# Patient Record
Sex: Female | Born: 1950 | Race: White | Hispanic: No | Marital: Married | Smoking: Former smoker
Health system: Southern US, Community
[De-identification: ages and names within clinical notes are randomized; demographics above are authoritative.]

## PROBLEM LIST (undated history)

## (undated) DIAGNOSIS — G4730 Sleep apnea, unspecified: Secondary | ICD-10-CM

## (undated) DIAGNOSIS — E785 Hyperlipidemia, unspecified: Secondary | ICD-10-CM

## (undated) HISTORY — PX: OTHER SURGICAL HISTORY: SHX169

## (undated) HISTORY — PX: GYNECOLOGIC CRYOSURGERY: SHX857

## (undated) HISTORY — PX: ANKLE FRACTURE SURGERY: SHX122

## (undated) HISTORY — DX: Sleep apnea, unspecified: G47.30

## (undated) HISTORY — DX: Hyperlipidemia, unspecified: E78.5

---

## 1998-10-15 ENCOUNTER — Other Ambulatory Visit: Admission: RE | Admit: 1998-10-15 | Discharge: 1998-10-15 | Payer: Self-pay | Admitting: Obstetrics and Gynecology

## 1999-12-24 ENCOUNTER — Other Ambulatory Visit: Admission: RE | Admit: 1999-12-24 | Discharge: 1999-12-24 | Payer: Self-pay | Admitting: Obstetrics and Gynecology

## 1999-12-30 ENCOUNTER — Encounter: Admission: RE | Admit: 1999-12-30 | Discharge: 1999-12-30 | Payer: Self-pay | Admitting: Obstetrics and Gynecology

## 1999-12-30 ENCOUNTER — Encounter: Payer: Self-pay | Admitting: Obstetrics and Gynecology

## 2000-01-05 ENCOUNTER — Encounter: Payer: Self-pay | Admitting: Obstetrics and Gynecology

## 2000-01-05 ENCOUNTER — Encounter: Admission: RE | Admit: 2000-01-05 | Discharge: 2000-01-05 | Payer: Self-pay | Admitting: Obstetrics and Gynecology

## 2001-03-06 ENCOUNTER — Encounter: Payer: Self-pay | Admitting: Obstetrics and Gynecology

## 2001-03-06 ENCOUNTER — Encounter: Admission: RE | Admit: 2001-03-06 | Discharge: 2001-03-06 | Payer: Self-pay | Admitting: Obstetrics and Gynecology

## 2001-03-13 ENCOUNTER — Other Ambulatory Visit: Admission: RE | Admit: 2001-03-13 | Discharge: 2001-03-13 | Payer: Self-pay | Admitting: Obstetrics and Gynecology

## 2002-03-09 ENCOUNTER — Encounter: Admission: RE | Admit: 2002-03-09 | Discharge: 2002-03-09 | Payer: Self-pay | Admitting: Obstetrics and Gynecology

## 2002-03-09 ENCOUNTER — Encounter: Payer: Self-pay | Admitting: Obstetrics and Gynecology

## 2002-04-06 ENCOUNTER — Other Ambulatory Visit: Admission: RE | Admit: 2002-04-06 | Discharge: 2002-04-06 | Payer: Self-pay | Admitting: Obstetrics and Gynecology

## 2003-01-02 ENCOUNTER — Ambulatory Visit (HOSPITAL_BASED_OUTPATIENT_CLINIC_OR_DEPARTMENT_OTHER): Admission: RE | Admit: 2003-01-02 | Discharge: 2003-01-02 | Payer: Self-pay | Admitting: Internal Medicine

## 2003-03-28 ENCOUNTER — Encounter: Admission: RE | Admit: 2003-03-28 | Discharge: 2003-03-28 | Payer: Self-pay | Admitting: Obstetrics and Gynecology

## 2003-03-28 ENCOUNTER — Encounter: Payer: Self-pay | Admitting: Obstetrics and Gynecology

## 2003-05-10 ENCOUNTER — Encounter: Admission: RE | Admit: 2003-05-10 | Discharge: 2003-05-10 | Payer: Self-pay | Admitting: Radiology

## 2004-04-14 ENCOUNTER — Other Ambulatory Visit: Admission: RE | Admit: 2004-04-14 | Discharge: 2004-04-14 | Payer: Self-pay | Admitting: Obstetrics and Gynecology

## 2004-10-21 ENCOUNTER — Encounter: Admission: RE | Admit: 2004-10-21 | Discharge: 2004-10-21 | Payer: Self-pay | Admitting: Obstetrics and Gynecology

## 2005-04-15 ENCOUNTER — Other Ambulatory Visit: Admission: RE | Admit: 2005-04-15 | Discharge: 2005-04-15 | Payer: Self-pay | Admitting: Obstetrics and Gynecology

## 2005-12-15 ENCOUNTER — Encounter: Admission: RE | Admit: 2005-12-15 | Discharge: 2005-12-15 | Payer: Self-pay | Admitting: Obstetrics and Gynecology

## 2006-12-20 ENCOUNTER — Encounter: Admission: RE | Admit: 2006-12-20 | Discharge: 2006-12-20 | Payer: Self-pay | Admitting: Obstetrics and Gynecology

## 2007-12-25 ENCOUNTER — Encounter: Admission: RE | Admit: 2007-12-25 | Discharge: 2007-12-25 | Payer: Self-pay | Admitting: Obstetrics and Gynecology

## 2007-12-26 ENCOUNTER — Other Ambulatory Visit: Admission: RE | Admit: 2007-12-26 | Discharge: 2007-12-26 | Payer: Self-pay | Admitting: Obstetrics and Gynecology

## 2008-01-03 ENCOUNTER — Encounter: Admission: RE | Admit: 2008-01-03 | Discharge: 2008-01-03 | Payer: Self-pay | Admitting: Internal Medicine

## 2008-04-01 ENCOUNTER — Encounter: Admission: RE | Admit: 2008-04-01 | Discharge: 2008-04-01 | Payer: Self-pay | Admitting: Internal Medicine

## 2009-01-10 ENCOUNTER — Encounter: Admission: RE | Admit: 2009-01-10 | Discharge: 2009-01-10 | Payer: Self-pay | Admitting: Obstetrics and Gynecology

## 2009-06-04 ENCOUNTER — Ambulatory Visit: Payer: Self-pay | Admitting: Obstetrics and Gynecology

## 2009-06-04 ENCOUNTER — Encounter: Payer: Self-pay | Admitting: Obstetrics and Gynecology

## 2009-06-04 ENCOUNTER — Other Ambulatory Visit: Admission: RE | Admit: 2009-06-04 | Discharge: 2009-06-04 | Payer: Self-pay | Admitting: Obstetrics and Gynecology

## 2010-01-20 ENCOUNTER — Encounter: Admission: RE | Admit: 2010-01-20 | Discharge: 2010-01-20 | Payer: Self-pay | Admitting: Obstetrics and Gynecology

## 2010-10-19 ENCOUNTER — Other Ambulatory Visit: Payer: Self-pay | Admitting: Internal Medicine

## 2010-10-19 ENCOUNTER — Ambulatory Visit
Admission: RE | Admit: 2010-10-19 | Discharge: 2010-10-19 | Disposition: A | Discharge status: Home / Self Care | Payer: PRIVATE HEALTH INSURANCE | Source: Ambulatory Visit | Attending: Internal Medicine | Admitting: Internal Medicine

## 2010-10-19 DIAGNOSIS — J069 Acute upper respiratory infection, unspecified: Secondary | ICD-10-CM

## 2010-12-21 ENCOUNTER — Other Ambulatory Visit (HOSPITAL_COMMUNITY)
Admission: RE | Admit: 2010-12-21 | Discharge: 2010-12-21 | Disposition: A | Discharge status: Home / Self Care | Payer: PRIVATE HEALTH INSURANCE | Source: Ambulatory Visit | Attending: Obstetrics and Gynecology | Admitting: Obstetrics and Gynecology

## 2010-12-21 ENCOUNTER — Encounter (INDEPENDENT_AMBULATORY_CARE_PROVIDER_SITE_OTHER): Payer: PRIVATE HEALTH INSURANCE | Admitting: Obstetrics and Gynecology

## 2010-12-21 ENCOUNTER — Other Ambulatory Visit: Payer: Self-pay | Admitting: Obstetrics and Gynecology

## 2010-12-21 DIAGNOSIS — Z01419 Encounter for gynecological examination (general) (routine) without abnormal findings: Secondary | ICD-10-CM

## 2010-12-21 DIAGNOSIS — Z124 Encounter for screening for malignant neoplasm of cervix: Secondary | ICD-10-CM | POA: Insufficient documentation

## 2011-01-05 ENCOUNTER — Institutional Professional Consult (permissible substitution): Payer: PRIVATE HEALTH INSURANCE | Admitting: Pulmonary Disease

## 2011-01-25 ENCOUNTER — Institutional Professional Consult (permissible substitution): Payer: PRIVATE HEALTH INSURANCE | Admitting: Pulmonary Disease

## 2011-02-01 ENCOUNTER — Other Ambulatory Visit: Payer: Self-pay | Admitting: Obstetrics and Gynecology

## 2011-02-01 DIAGNOSIS — Z1231 Encounter for screening mammogram for malignant neoplasm of breast: Secondary | ICD-10-CM

## 2011-02-02 ENCOUNTER — Ambulatory Visit
Admission: RE | Admit: 2011-02-02 | Discharge: 2011-02-02 | Disposition: A | Discharge status: Home / Self Care | Payer: No Typology Code available for payment source | Source: Ambulatory Visit | Attending: Obstetrics and Gynecology | Admitting: Obstetrics and Gynecology

## 2011-02-02 DIAGNOSIS — Z1231 Encounter for screening mammogram for malignant neoplasm of breast: Secondary | ICD-10-CM

## 2011-10-12 ENCOUNTER — Encounter: Payer: Self-pay | Admitting: Gastroenterology

## 2011-10-18 ENCOUNTER — Ambulatory Visit (AMBULATORY_SURGERY_CENTER): Payer: No Typology Code available for payment source | Admitting: *Deleted

## 2011-10-18 VITALS — Ht 66.5 in | Wt 197.8 lb

## 2011-10-18 DIAGNOSIS — Z1211 Encounter for screening for malignant neoplasm of colon: Secondary | ICD-10-CM

## 2011-10-18 MED ORDER — PEG-KCL-NACL-NASULF-NA ASC-C 100 G PO SOLR: ORAL | Status: DC

## 2011-11-01 ENCOUNTER — Ambulatory Visit (AMBULATORY_SURGERY_CENTER): Payer: No Typology Code available for payment source | Admitting: Gastroenterology

## 2011-11-01 ENCOUNTER — Telehealth: Payer: Self-pay | Admitting: *Deleted

## 2011-11-01 ENCOUNTER — Encounter: Payer: Self-pay | Admitting: Gastroenterology

## 2011-11-01 VITALS — BP 119/61 | HR 69 | Temp 97.6°F | Resp 16 | Ht 66.0 in | Wt 197.0 lb

## 2011-11-01 DIAGNOSIS — Z1211 Encounter for screening for malignant neoplasm of colon: Secondary | ICD-10-CM

## 2011-11-01 HISTORY — PX: COLONOSCOPY: SHX174

## 2011-11-01 MED ORDER — SODIUM CHLORIDE 0.9 % IV SOLN: 500.0000 mL | INTRAVENOUS | Status: DC

## 2011-11-01 NOTE — Op Note (Signed)
[facility] [address] [place], [place]  [address]  COLONOSCOPY PROCEDURE REPORT  PATIENT:  Kaitlyn, Norman  MR#:  [id number] BIRTHDATE:  05-29-1951, 60 yrs. old  GENDER:  female ENDOSCOPIST:  Vania Rea. Jarold Motto, MD, [facility] REF. BY: PROCEDURE DATE:  11/01/2011 PROCEDURE:  Average-risk screening colonoscopy G0121 ASA CLASS:  Class II INDICATIONS:  Routine Risk Screening MEDICATIONS:   propofol (Diprivan) 200 mg IV  DESCRIPTION OF PROCEDURE:   After the risks and benefits and of the procedure were explained, informed consent was obtained. Digital rectal exam was performed and revealed no abnormalities. The LB CF-[id number] endoscope was introduced through the anus and advanced to the cecum, which was identified by both the appendix and ileocecal valve.  The quality of the prep was excellent, using MoviPrep.  The instrument was then slowly withdrawn as the colon was fully examined. <<PROCEDUREIMAGES>>  FINDINGS:  No polyps or cancers were seen.  This was otherwise a normal examination of the colon.   Retroflexed views in the rectum revealed no abnormalities.    The scope was then withdrawn from the patient and the procedure completed.  COMPLICATIONS:  None ENDOSCOPIC IMPRESSION: 1) No polyps or cancers 2) Otherwise normal examination RECOMMENDATIONS: 1) Continue current colorectal screening recommendations for "routine risk" patients with a repeat colonoscopy in 10 years.  REPEAT EXAM:  No  ______________________________ Vania Rea. Jarold Motto, MD, [facility]  CC:  Rodrigo Ran, MD  n. Rosalie DoctorMarland Kitchen   Vania Rea. Ugochi Henzler at 11/01/2011 08:52 AM  Darrick Huntsman, [phone]

## 2011-11-01 NOTE — Progress Notes (Signed)
Patient did not experience any of the following events: a burn prior to discharge; a fall within the facility; wrong site/side/patient/procedure/implant event; or a hospital transfer or hospital admission upon discharge from the facility. (G8907) Patient did not have preoperative order for IV antibiotic SSI prophylaxis. (G8918)  

## 2011-11-01 NOTE — Patient Instructions (Signed)
YOU HAD AN ENDOSCOPIC PROCEDURE TODAY AT THE [facility]: Refer to the procedure report that was given to you for any specific questions about what was found during the examination.  If the procedure report does not answer your questions, please call your gastroenterologist to clarify.  If you requested that your care partner not be given the details of your procedure findings, then the procedure report has been included in a sealed envelope for you to review at your convenience later.  YOU SHOULD EXPECT: Some feelings of bloating in the abdomen. Passage of more gas than usual.  Walking can help get rid of the air that was put into your GI tract during the procedure and reduce the bloating. If you had a lower endoscopy (such as a colonoscopy or flexible sigmoidoscopy) you may notice spotting of blood in your stool or on the toilet paper. If you underwent a bowel prep for your procedure, then you may not have a normal bowel movement for a few days.  DIET: Your first meal following the procedure should be a light meal and then it is ok to progress to your normal diet.  A half-sandwich or bowl of soup is an example of a good first meal.  Heavy or fried foods are harder to digest and may make you feel nauseous or bloated.  Likewise meals heavy in dairy and vegetables can cause extra gas to form and this can also increase the bloating.  Drink plenty of fluids but you should avoid alcoholic beverages for 24 hours.  ACTIVITY: Your care partner should take you home directly after the procedure.  You should plan to take it easy, moving slowly for the rest of the day.  You can resume normal activity the day after the procedure however you should NOT DRIVE or use heavy machinery for 24 hours (because of the sedation medicines used during the test).    SYMPTOMS TO REPORT IMMEDIATELY: A gastroenterologist can be reached at any hour.  During normal business hours, 8:30 AM to 5:00 PM Monday through Friday,  call [phone].  After hours and on weekends, please call the GI answering service at [phone] who will take a message and have the physician on call contact you.   Following lower endoscopy (colonoscopy or flexible sigmoidoscopy):  Excessive amounts of blood in the stool  Significant tenderness or worsening of abdominal pains  Swelling of the abdomen that is new, acute  Fever of 100F or higher  Following upper endoscopy (EGD)  Vomiting of blood or coffee ground material  New chest pain or pain under the shoulder blades  Painful or persistently difficult swallowing  New shortness of breath  Fever of 100F or higher  Black, tarry-looking stools  FOLLOW UP: If any biopsies were taken you will be contacted by phone or by letter within the next 1-3 weeks.  Call your gastroenterologist if you have not heard about the biopsies in 3 weeks.  Our staff will call the home number listed on your records the next business day following your procedure to check on you and address any questions or concerns that you may have at that time regarding the information given to you following your procedure. This is a courtesy call and so if there is no answer at the home number and we have not heard from you through the emergency physician on call, we will assume that you have returned to your regular daily activities without incident.  SIGNATURES/CONFIDENTIALITY: You and/or your care   partner have signed paperwork which will be entered into your electronic medical record.  These signatures attest to the fact that that the information above on your After Visit Summary has been reviewed and is understood.  Full responsibility of the confidentiality of this discharge information lies with you and/or your care-partner.  

## 2011-11-01 NOTE — Telephone Encounter (Signed)
Patient said she is 61 years old now and wants to know what are the recommendations for her age for pap smears now.  Please advise

## 2011-11-02 ENCOUNTER — Telehealth: Payer: Self-pay | Admitting: *Deleted

## 2011-11-02 NOTE — Telephone Encounter (Signed)
  Follow up Call-  Call back number 11/01/2011  Post procedure Call Back phone  # [phone]  Permission to leave phone message Yes     Patient questions:  Do you have a fever, pain , or abdominal swelling? no   Have you tolerated food without any problems? yes  Have you been able to return to your normal activities? yes  Do you have any questions about your discharge instructions: Diet   no Medications  no Follow up visit  no  Do you have questions or concerns about your Care? no  Actions: * If pain score is 4 or above:

## 2011-11-02 NOTE — Telephone Encounter (Signed)
Lm on patient's voice mail with information per her request.

## 2011-11-02 NOTE — Telephone Encounter (Signed)
Tell patient that if the patient has not had abnormal Pap smears or been treated for cervical dysplasia she only needs a Pap smear every 3 years from 30-65. However everyone still recommends yearly visits to the gynecologist for her breast exams and pelvic exams.

## 2011-12-15 ENCOUNTER — Encounter: Payer: Self-pay | Admitting: Obstetrics and Gynecology

## 2011-12-15 DIAGNOSIS — J45909 Unspecified asthma, uncomplicated: Secondary | ICD-10-CM | POA: Insufficient documentation

## 2011-12-22 ENCOUNTER — Encounter: Payer: No Typology Code available for payment source | Admitting: Obstetrics and Gynecology

## 2011-12-27 ENCOUNTER — Ambulatory Visit (INDEPENDENT_AMBULATORY_CARE_PROVIDER_SITE_OTHER): Payer: No Typology Code available for payment source | Admitting: Obstetrics and Gynecology

## 2011-12-27 ENCOUNTER — Other Ambulatory Visit (HOSPITAL_COMMUNITY)
Admission: RE | Admit: 2011-12-27 | Discharge: 2011-12-27 | Disposition: A | Discharge status: Home / Self Care | Payer: No Typology Code available for payment source | Source: Ambulatory Visit | Attending: Obstetrics and Gynecology | Admitting: Obstetrics and Gynecology

## 2011-12-27 ENCOUNTER — Encounter: Payer: Self-pay | Admitting: Obstetrics and Gynecology

## 2011-12-27 VITALS — BP 116/76 | Ht 66.0 in | Wt 196.0 lb

## 2011-12-27 DIAGNOSIS — Z01419 Encounter for gynecological examination (general) (routine) without abnormal findings: Secondary | ICD-10-CM

## 2011-12-27 NOTE — Progress Notes (Signed)
Patient came to see me today for her annual GYN exam. She is not having menopausal symptoms. She is not having vaginal dryness. She is having no bladder frequency, urgency or incontinence. She is having no vaginal bleeding. She is having no pelvic pain. She is up-to-date on mammograms. She had a bone density last year through her PCP which was stable. She has a little bit of increase hair growth on her lower chin. She does her lab through her PCP.  Physical examination: Kennon Portela present. HEENT within normal limits. Minimal hair growth on lower chin. No increased hair growth elsewhere.   Neck: Thyroid not large. No masses. Supraclavicular nodes: not enlarged. Breasts: Examined in both sitting and lying  position. No skin changes and no masses. Abdomen: Soft no guarding rebound or masses or hernia. Pelvic: External: Within normal limits. BUS: Within normal limits. Vaginal:within normal limits. Good estrogen effect. No evidence of cystocele rectocele or enterocele. Cervix: clean. Uterus: Normal size and shape. Adnexa: No masses. Rectovaginal exam: Confirmatory and negative. Extremities: Within normal limits.  Assessment: Normal GYN exam  Plan: Reassured about hair growth. Continue yearly mammograms.

## 2011-12-28 LAB — URINALYSIS W MICROSCOPIC + REFLEX CULTURE
Bacteria, UA: NONE SEEN
Glucose, UA: NEGATIVE mg/dL
Squamous Epithelial / LPF: NONE SEEN
Urobilinogen, UA: 1 mg/dL (ref 0.0–1.0)

## 2012-02-15 ENCOUNTER — Other Ambulatory Visit: Payer: Self-pay

## 2012-03-08 ENCOUNTER — Other Ambulatory Visit: Payer: Self-pay | Admitting: Obstetrics and Gynecology

## 2012-03-08 DIAGNOSIS — Z1231 Encounter for screening mammogram for malignant neoplasm of breast: Secondary | ICD-10-CM

## 2012-03-14 ENCOUNTER — Ambulatory Visit
Admission: RE | Admit: 2012-03-14 | Discharge: 2012-03-14 | Disposition: A | Discharge status: Home / Self Care | Payer: No Typology Code available for payment source | Source: Ambulatory Visit | Attending: Obstetrics and Gynecology | Admitting: Obstetrics and Gynecology

## 2012-03-14 ENCOUNTER — Ambulatory Visit: Payer: No Typology Code available for payment source

## 2012-03-14 DIAGNOSIS — Z1231 Encounter for screening mammogram for malignant neoplasm of breast: Secondary | ICD-10-CM

## 2012-06-16 ENCOUNTER — Emergency Department (HOSPITAL_COMMUNITY): Payer: PRIVATE HEALTH INSURANCE

## 2012-06-16 ENCOUNTER — Emergency Department (HOSPITAL_COMMUNITY)
Admission: EM | Admit: 2012-06-16 | Discharge: 2012-06-16 | Disposition: A | Discharge status: Home / Self Care | Payer: PRIVATE HEALTH INSURANCE | Attending: Emergency Medicine | Admitting: Emergency Medicine

## 2012-06-16 DIAGNOSIS — Z87891 Personal history of nicotine dependence: Secondary | ICD-10-CM | POA: Insufficient documentation

## 2012-06-16 DIAGNOSIS — S82852A Displaced trimalleolar fracture of left lower leg, initial encounter for closed fracture: Secondary | ICD-10-CM

## 2012-06-16 DIAGNOSIS — W108XXA Fall (on) (from) other stairs and steps, initial encounter: Secondary | ICD-10-CM | POA: Insufficient documentation

## 2012-06-16 DIAGNOSIS — Y929 Unspecified place or not applicable: Secondary | ICD-10-CM | POA: Insufficient documentation

## 2012-06-16 DIAGNOSIS — S82853A Displaced trimalleolar fracture of unspecified lower leg, initial encounter for closed fracture: Secondary | ICD-10-CM | POA: Insufficient documentation

## 2012-06-16 DIAGNOSIS — Y9301 Activity, walking, marching and hiking: Secondary | ICD-10-CM | POA: Insufficient documentation

## 2012-06-16 DIAGNOSIS — J45909 Unspecified asthma, uncomplicated: Secondary | ICD-10-CM | POA: Insufficient documentation

## 2012-06-16 MED ORDER — ONDANSETRON 4 MG PO TBDP: 8.0000 mg | ORAL_TABLET | Freq: Once | ORAL | Status: DC

## 2012-06-16 MED ORDER — HYDROMORPHONE HCL PF 1 MG/ML IJ SOLN: 1.0000 mg | Freq: Once | INTRAMUSCULAR | Status: DC

## 2012-06-16 MED ORDER — OXYCODONE-ACETAMINOPHEN 5-325 MG PO TABS: 1.0000 | ORAL_TABLET | ORAL | Status: DC | PRN

## 2012-06-16 NOTE — Progress Notes (Signed)
Orthopedic Tech Progress Note Patient Details:  Kaitlyn Norman [birth year] [id number]  Patient ID: Kaitlyn Norman, female   DOB: [birth year], 61 y.o.   MRN: [id number] Viewed order from rn order list  Kaitlyn Norman 06/16/2012, 11:29 PM

## 2012-06-16 NOTE — ED Notes (Signed)
Pt removed from LSB & c collar by [employer] [place]

## 2012-06-16 NOTE — Progress Notes (Signed)
Orthopedic Tech Progress Note Patient Details:  Kaitlyn Norman 02-07-1951 [id number]  Ortho Devices Type of Ortho Device: Post (short) splint;Stirrup splint Splint Material: Plaster Ortho Device/Splint Location: right leg Ortho Device/Splint Interventions: Application   Maureen Duesing 06/16/2012, 11:29 PM

## 2012-06-16 NOTE — ED Provider Notes (Signed)
History     CSN: [id number]  Arrival date & time 06/16/12  2153   First MD Initiated Contact with Patient 06/16/12 2209      Chief Complaint  Patient presents with  . Fall    (Consider location/radiation/quality/duration/timing/severity/associated sxs/prior treatment) HPI History provided by pt.   Pt missed bottom step while walking down the stairs this evening and fell, landing on her buttocks.  Denies having dizziness, lightheadedness, CP, SOB or palpitations prior to fall.  Did not hit head and denies neck/back pain.  C/o pain in right ankle only.  Unable to bear weight.  No associated paresthesias.  Has not taken anything for pain.  Otherwise feeling well.  Past Medical History  Diagnosis Date  . Asthma     Past Surgical History  Procedure Date  . Excision of enlarged lymph node   . Gynecologic cryosurgery     Family History  Problem Relation Age of Onset  . Lung cancer Mother   . Hypertension Mother   . Cancer Mother     LUNG CANCER  . Osteopenia Mother   . Prostate cancer Father   . Cancer Father     PROSTATE CANCER  . Colon cancer Neg Hx   . Cancer Brother     THROAT CANCER  . Diabetes Paternal Uncle     History  Substance Use Topics  . Smoking status: Former Games developer  . Smokeless tobacco: Never Used  . Alcohol Use: 3.6 oz/week    6 Glasses of wine per week    OB History    Grav Para Term Preterm Abortions TAB SAB Ect Mult Living   3 2 1 1 1     2       Review of Systems  All other systems reviewed and are negative.    Allergies  Review of patient's allergies indicates no known allergies.  Home Medications  No current outpatient prescriptions on file.  BP 132/78  Pulse 80  Temp 98.3 F (36.8 C) (Oral)  Resp 18  SpO2 98%  Physical Exam  Nursing note and vitals reviewed. Constitutional: She is oriented to person, place, and time. She appears well-developed and well-nourished. No distress.  HENT:  Head: Normocephalic and atraumatic.    Eyes:       Normal appearance  Neck: Normal range of motion.  Cardiovascular: Normal rate and regular rhythm.   Pulmonary/Chest: Effort normal and breath sounds normal. No respiratory distress.  Musculoskeletal: Normal range of motion.       Cervical spine non-tender and no pain w/ active head rotation.  Pelvis stable and no hip tenderness.  R knee non-tender and full ROM w/out pain.  Right ankle diffusely edematous and w/ mild ecchymosis.  Tenderness over lateral malleolus and lateral malleolus and distal 1/3 anterior tibia.  Pain w/ passive ROM. 2+ DP pulse and distal sensation intact.    Neurological: She is alert and oriented to person, place, and time.  Skin: Skin is warm and dry. No rash noted.  Psychiatric: She has a normal mood and affect. Her behavior is normal.    ED Course  Procedures (including critical care time)  Labs Reviewed - No data to display Dg Ankle Complete Right  06/16/2012  *RADIOLOGY REPORT*  Clinical Data: Fall, ankle pain.  RIGHT ANKLE - COMPLETE 3+ VIEW  Comparison: None.  Findings: There is a trimalleolar fracture of the right ankle. Fracture fragments are moderately displaced.  Disruption of the ankle mortise with widening medially.  Plantar calcaneal  spur.  Diffuse soft tissue swelling.  IMPRESSION: Trimalleolar fracture.   Original Report Authenticated By: Charlett Nose, M.D.      1. Closed trimalleolar fracture of left ankle       MDM  Pt presents w/  right ankle pain s/p mechanical fall this evening.  Xray shows trimalleolar fx.  Dr. Victorino Dike consulted by patient's son who is a physician of [facility]. He has seen pt in ED and recommends posterior splint (performed by ortho tech) and d/c w/ pain medication.  She will f/u with him on Monday; surgery scheduled for Tuesday.          Arie Sabina [place], [place] 06/17/12 [phone]

## 2012-06-16 NOTE — ED Notes (Signed)
Pt in from home via [facility] c/o R ankle with no obvious deformity present, pt in LSB & C collar upon arrival to ED, pt denies head injury & LOC, pt A&O x4, follows commands, & speaks in complete sentences

## 2012-06-17 NOTE — Consult Note (Signed)
Reason for Consult:right ankle injury Referring Physician: Dr. Leda Quail is an 61 y.o. female.  HPI: 61 y/o female without significant pmh missed a step at home earlier this evening and fell twisting her right ankle.  She c/o mild aching pain the ankle that is worse with any attempt at motion and better with keeping it still.  She denies any h/o injury or surgery to the right ankle in the past.  She denies f/c/n/v/wt loss.  No h/o diabetes, smoking or hypothyroidism.  Past Medical History  Diagnosis Date  . Asthma     Past Surgical History  Procedure Date  . Excision of enlarged lymph node   . Gynecologic cryosurgery     Family History  Problem Relation Age of Onset  . Lung cancer Mother   . Hypertension Mother   . Cancer Mother     LUNG CANCER  . Osteopenia Mother   . Prostate cancer Father   . Cancer Father     PROSTATE CANCER  . Colon cancer Neg Hx   . Cancer Brother     THROAT CANCER  . Diabetes Paternal Uncle     Social History:  reports that she has quit smoking. She has never used smokeless tobacco. She reports that she drinks about 3.6 ounces of alcohol per week. She reports that she does not use illicit drugs.  Allergies: No Known Allergies  Medications: I have reviewed the patient's current medications.  No results found for this or any previous visit (from the past 48 hour(s)).  Dg Ankle Complete Right  06/16/2012  *RADIOLOGY REPORT*  Clinical Data: Fall, ankle pain.  RIGHT ANKLE - COMPLETE 3+ VIEW  Comparison: None.  Findings: There is a trimalleolar fracture of the right ankle. Fracture fragments are moderately displaced.  Disruption of the ankle mortise with widening medially.  Plantar calcaneal spur.  Diffuse soft tissue swelling.  IMPRESSION: Trimalleolar fracture.   Original Report Authenticated By: Charlett Nose, M.D.     ROS:  As above PE:  Blood pressure 132/78, pulse 80, temperature 98.3 F (36.8 C), temperature source Oral, resp. rate  18, SpO2 98.00%. wn wd woman in nad.  A and O x 4.  Mood and affect normal.  EOMI.  Respirations unlabored.  R ankle with moderate swelling.  Superficial abrasion anteriorly.  2+ dp and pt pulses.  Feels LT normally about the foot and ankle.  Active PF and DF of the toes 5/5.  No lymphadenopathy.  Assessment/Plan: Right ankle trimal fracture - I explained the nature of the injury to she and her husband in detail.  She is splinted tonight and will go home NWB on the R LE.  She'll keep it elevated and f/u with me in the office Monday.  If her swelling has improved, we'll plan for surgery on Tuesday.  She understands the plan and agrees.  Toni Arthurs 06/17/2012, 12:06 AM

## 2012-06-18 NOTE — ED Provider Notes (Signed)
Medical screening examination/treatment/procedure(s) were performed by non-physician practitioner and as supervising physician I was immediately available for consultation/collaboration.  Hurman Horn, MD 06/18/12 [phone]

## 2012-12-28 ENCOUNTER — Other Ambulatory Visit: Payer: Self-pay | Admitting: Obstetrics and Gynecology

## 2013-02-01 ENCOUNTER — Other Ambulatory Visit: Payer: Self-pay

## 2013-04-25 ENCOUNTER — Other Ambulatory Visit: Payer: Self-pay

## 2013-04-25 DIAGNOSIS — Z1231 Encounter for screening mammogram for malignant neoplasm of breast: Secondary | ICD-10-CM

## 2013-05-15 ENCOUNTER — Ambulatory Visit: Payer: No Typology Code available for payment source

## 2013-05-24 ENCOUNTER — Ambulatory Visit
Admission: RE | Admit: 2013-05-24 | Discharge: 2013-05-24 | Disposition: A | Discharge status: Home / Self Care | Payer: No Typology Code available for payment source | Source: Ambulatory Visit

## 2013-05-24 DIAGNOSIS — Z1231 Encounter for screening mammogram for malignant neoplasm of breast: Secondary | ICD-10-CM

## 2014-06-17 ENCOUNTER — Encounter: Payer: Self-pay | Admitting: Obstetrics and Gynecology

## 2015-01-06 ENCOUNTER — Ambulatory Visit
Admission: RE | Admit: 2015-01-06 | Discharge: 2015-01-06 | Disposition: A | Discharge status: Home / Self Care | Payer: PRIVATE HEALTH INSURANCE | Source: Ambulatory Visit

## 2015-01-06 ENCOUNTER — Other Ambulatory Visit: Payer: Self-pay

## 2015-01-06 DIAGNOSIS — Z1231 Encounter for screening mammogram for malignant neoplasm of breast: Secondary | ICD-10-CM

## 2015-05-19 ENCOUNTER — Other Ambulatory Visit: Payer: Self-pay | Admitting: Obstetrics and Gynecology

## 2015-05-20 LAB — CYTOLOGY - PAP

## 2015-09-12 DIAGNOSIS — B029 Zoster without complications: Secondary | ICD-10-CM | POA: Diagnosis not present

## 2015-12-05 ENCOUNTER — Other Ambulatory Visit: Payer: Self-pay

## 2015-12-05 DIAGNOSIS — M859 Disorder of bone density and structure, unspecified: Secondary | ICD-10-CM | POA: Diagnosis not present

## 2015-12-05 DIAGNOSIS — Z1231 Encounter for screening mammogram for malignant neoplasm of breast: Secondary | ICD-10-CM

## 2016-01-14 ENCOUNTER — Ambulatory Visit
Admission: RE | Admit: 2016-01-14 | Discharge: 2016-01-14 | Disposition: A | Discharge status: Home / Self Care | Payer: PPO | Source: Ambulatory Visit

## 2016-01-14 DIAGNOSIS — Z1231 Encounter for screening mammogram for malignant neoplasm of breast: Secondary | ICD-10-CM

## 2016-03-17 ENCOUNTER — Encounter: Payer: Self-pay | Admitting: Sports Medicine

## 2016-03-17 ENCOUNTER — Ambulatory Visit (INDEPENDENT_AMBULATORY_CARE_PROVIDER_SITE_OTHER): Payer: PPO | Admitting: Sports Medicine

## 2016-03-17 DIAGNOSIS — M7662 Achilles tendinitis, left leg: Secondary | ICD-10-CM

## 2016-03-17 MED ORDER — NITROGLYCERIN 0.2 MG/HR TD PT24: MEDICATED_PATCH | TRANSDERMAL | 1 refills | Status: DC

## 2016-03-17 NOTE — Progress Notes (Signed)
  CC Left ankle pain and swelling  approx 1 week ago patient went to sleep Had severe left calf cramp Gradually went away but pain down from lower calf to ankle  Awakened next morining with swelling behind lat malleolus Discolored  This has persisted and painful to walk or exercise too much  Past Hx Trimalleolar fx of RT ankle with repair Asthma - mild and stable  ROS Some occassional foot and ankle pain before this on left Denies left ankle swelling prior Some chronic RT ankle swelling  PEXam  Pleasant female in no acute distress BP 136/78   Left Ankle:  visible swelling behind lat mall Not discolored today Range of motion is full in all directions. Strength is 5/5 in all directions. Stable lateral and medial ligaments; squeeze test and kleiger test unremarkable; Talar dome nontender; No pain at base of 5th MT; No tenderness over cuboid; No tenderness over N spot or navicular prominence No tenderness on posterior aspects of lateral and medial malleolus THere is mild TTP over distal AT No sign of peroneal tendon subluxations; Negative tarsal tunnel tinel's Able to walk 4 steps.  RT ankle Surgical scars and mild chronic swelling  [place] Achilles tendon insertion on the left is 0.92 cm There is hypoechoic change within the tendon 2 cm above the calcaneus there is linear calcification On transverse view there are splits within the tendon substance of about 20% This is just below the calcifications There is also a linear split leading from the calcifications down to the calcaneus Small Haglund's spur at the heel  Comparison on the right Achilles tendon shows that it is 0.58 cm There is no calcification or irregularity  Peroneal tendons look intact Hypoechoic change is seen in the soft tissues around the posterior ankle

## 2016-03-17 NOTE — Patient Instructions (Signed)

## 2016-03-17 NOTE — Assessment & Plan Note (Signed)
I suspect the severe cramp caused a partial tear in the tendon This led to swelling However, I suspect she had chronic tendinopathy with a calcification and thickening before this  Heel lift Ankle compression Nitroglycerin protocol Easy heel raises on both feet  Repeat scan in 2 weeks to be sure there is no progression of a tear

## 2016-03-24 ENCOUNTER — Other Ambulatory Visit: Payer: PPO | Admitting: Sports Medicine

## 2016-03-31 ENCOUNTER — Encounter: Payer: Self-pay | Admitting: Sports Medicine

## 2016-03-31 ENCOUNTER — Ambulatory Visit (INDEPENDENT_AMBULATORY_CARE_PROVIDER_SITE_OTHER): Payer: PPO | Admitting: Sports Medicine

## 2016-03-31 DIAGNOSIS — M7662 Achilles tendinitis, left leg: Secondary | ICD-10-CM

## 2016-03-31 NOTE — Progress Notes (Signed)
Chief complaint-left Achilles pain  Patient returns 2 weeks after we diagnosed a partial tear of her Achilles tendon following a severe nocturnal cramp She has rested the foot for the past 2 weeks with only walking as needed She started nitroglycerin without side effects and feels that she has less pain within a few days Swelling has lessened considerably The discoloration has resolved  Past history Right ankle fracture requiring surgical stabilization  Review of systems No sciatica No swelling in left knee  No pain in left foot  Physical exam Pleasant female in no acute distress BP 115/61   Ht 5\' 6"  (1.676 m)   Wt 180 lb (81.6 kg)   BMI 29.05 kg/m   Left Achilles tendon shows minimal tenderness to palpation There is some puffiness in the peroneal tendon sheath area This is less than on prior exam No redness or discoloration Normal ankle motion Pain with more than 30 of dorsiflexion No pain on plantar flexion  Ultrasound Calcification at the distal Achilles tendon as noted before The medial aspect of the tendon is moderately thickened at 0.67 but appears normal texture Lateral aspect of the tendon shows the calcifications and a small area of fiber loss Swelling is decreased There are neo-vessels noted on Doppler

## 2016-03-31 NOTE — Assessment & Plan Note (Signed)
This has improved both clinically and on ultrasound Continue nitroglycerin protocol Gradually increase the rehabilitation exercises as demonstrated Okay to begin some easy walking but avoid hills Use heel lifts in shoes  Recheck in 4-6 weeks

## 2016-05-01 DIAGNOSIS — Z23 Encounter for immunization: Secondary | ICD-10-CM | POA: Diagnosis not present

## 2016-05-05 ENCOUNTER — Encounter: Payer: Self-pay | Admitting: Sports Medicine

## 2016-05-05 ENCOUNTER — Ambulatory Visit (INDEPENDENT_AMBULATORY_CARE_PROVIDER_SITE_OTHER): Payer: PPO | Admitting: Sports Medicine

## 2016-05-05 DIAGNOSIS — H40052 Ocular hypertension, left eye: Secondary | ICD-10-CM | POA: Diagnosis not present

## 2016-05-05 DIAGNOSIS — M7662 Achilles tendinitis, left leg: Secondary | ICD-10-CM | POA: Diagnosis not present

## 2016-05-05 DIAGNOSIS — H524 Presbyopia: Secondary | ICD-10-CM | POA: Diagnosis not present

## 2016-05-05 DIAGNOSIS — H52203 Unspecified astigmatism, bilateral: Secondary | ICD-10-CM | POA: Diagnosis not present

## 2016-05-05 DIAGNOSIS — H40051 Ocular hypertension, right eye: Secondary | ICD-10-CM | POA: Diagnosis not present

## 2016-05-05 NOTE — Assessment & Plan Note (Signed)
This has improved remarkably both clinically and on ultrasound scanning  Continue nitroglycerin protocol for 6 more weeks We will start doing the eccentric component of her heel raises and progress the number of these Resume more normal walking activities  Recheck and repeat her scan in 6 weeks

## 2016-05-05 NOTE — Progress Notes (Signed)
Chief complaint: left Achilles tendon tear  Patient is now 6 weeks into a nitroglycerin protocol treatment for partially torn left Achilles tendon This actually occurred after a nocturnal cramp She may have strained the Achilles earlier working on a ladder and doing other activities  She states she now feels no pain She is able to walk up steps She walks the dog around the block without difficulty She is able to do heel raises on 1 leg without increasing the pain  No side effects from nitroglycerin use  Review of systems Much less swelling around the posterior heel and ankle No pain in the right Achilles No numbness or tingling noted in the foot No additional cramps  Physical examination Pleasant female in no acute distress BP 120/70   Ht 5\' 6"  (1.676 m)   Wt 180 lb (81.6 kg)   BMI 29.05 kg/m   Left ankle shows full range of motion Stable to ligamentous testing and no pain Palpation of the left Achilles tendon suggested slightly thicker than the right Today on squeeze there is no real pain There is no nodule felt There is less but still some swelling in the left peroneal sheath  Ultrasound of left Achilles There is a calcific deposit in the tendon about 2 cm above the calcaneal insertion Prior hypoechoic change is noted in this area are much less and it appears that tendon tissue crosses the prior gap Transverse scan shows very little tendon disruption Doppler scanning shows mild increase in flow only around the calcific change Haglund deformity noted  Impression: Gradual but 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

## 2016-05-20 DIAGNOSIS — Z124 Encounter for screening for malignant neoplasm of cervix: Secondary | ICD-10-CM | POA: Diagnosis not present

## 2016-05-20 DIAGNOSIS — Z6832 Body mass index (BMI) 32.0-32.9, adult: Secondary | ICD-10-CM | POA: Diagnosis not present

## 2016-06-16 ENCOUNTER — Ambulatory Visit: Payer: PPO | Admitting: Sports Medicine

## 2016-06-24 DIAGNOSIS — E785 Hyperlipidemia, unspecified: Secondary | ICD-10-CM | POA: Diagnosis not present

## 2016-06-24 DIAGNOSIS — E559 Vitamin D deficiency, unspecified: Secondary | ICD-10-CM | POA: Diagnosis not present

## 2016-06-24 DIAGNOSIS — D539 Nutritional anemia, unspecified: Secondary | ICD-10-CM | POA: Diagnosis not present

## 2016-06-24 DIAGNOSIS — E784 Other hyperlipidemia: Secondary | ICD-10-CM | POA: Diagnosis not present

## 2016-06-24 DIAGNOSIS — D538 Other specified nutritional anemias: Secondary | ICD-10-CM | POA: Diagnosis not present

## 2016-07-01 DIAGNOSIS — R0683 Snoring: Secondary | ICD-10-CM | POA: Diagnosis not present

## 2016-07-01 DIAGNOSIS — E784 Other hyperlipidemia: Secondary | ICD-10-CM | POA: Diagnosis not present

## 2016-07-01 DIAGNOSIS — H8112 Benign paroxysmal vertigo, left ear: Secondary | ICD-10-CM | POA: Diagnosis not present

## 2016-07-01 DIAGNOSIS — Z1389 Encounter for screening for other disorder: Secondary | ICD-10-CM | POA: Diagnosis not present

## 2016-07-01 DIAGNOSIS — Z0000 Encounter for general adult medical examination without abnormal findings: Secondary | ICD-10-CM | POA: Diagnosis not present

## 2016-07-01 DIAGNOSIS — M859 Disorder of bone density and structure, unspecified: Secondary | ICD-10-CM | POA: Diagnosis not present

## 2016-07-01 DIAGNOSIS — Z6832 Body mass index (BMI) 32.0-32.9, adult: Secondary | ICD-10-CM | POA: Diagnosis not present

## 2016-07-01 DIAGNOSIS — E668 Other obesity: Secondary | ICD-10-CM | POA: Diagnosis not present

## 2016-07-01 DIAGNOSIS — N318 Other neuromuscular dysfunction of bladder: Secondary | ICD-10-CM | POA: Diagnosis not present

## 2016-07-01 DIAGNOSIS — D7589 Other specified diseases of blood and blood-forming organs: Secondary | ICD-10-CM | POA: Diagnosis not present

## 2016-07-01 DIAGNOSIS — E559 Vitamin D deficiency, unspecified: Secondary | ICD-10-CM | POA: Diagnosis not present

## 2016-07-01 DIAGNOSIS — K219 Gastro-esophageal reflux disease without esophagitis: Secondary | ICD-10-CM | POA: Diagnosis not present

## 2016-07-26 DIAGNOSIS — Z1212 Encounter for screening for malignant neoplasm of rectum: Secondary | ICD-10-CM | POA: Diagnosis not present

## 2016-07-28 ENCOUNTER — Ambulatory Visit (INDEPENDENT_AMBULATORY_CARE_PROVIDER_SITE_OTHER): Payer: PPO | Admitting: Neurology

## 2016-07-28 ENCOUNTER — Encounter: Payer: Self-pay | Admitting: Neurology

## 2016-07-28 VITALS — BP 114/82 | HR 70 | Resp 16 | Ht 66.0 in | Wt 198.0 lb

## 2016-07-28 DIAGNOSIS — E6609 Other obesity due to excess calories: Secondary | ICD-10-CM

## 2016-07-28 DIAGNOSIS — G4719 Other hypersomnia: Secondary | ICD-10-CM | POA: Diagnosis not present

## 2016-07-28 DIAGNOSIS — G4710 Hypersomnia, unspecified: Secondary | ICD-10-CM | POA: Diagnosis not present

## 2016-07-28 DIAGNOSIS — R0683 Snoring: Secondary | ICD-10-CM | POA: Diagnosis not present

## 2016-07-28 DIAGNOSIS — G4730 Sleep apnea, unspecified: Secondary | ICD-10-CM

## 2016-07-28 NOTE — Progress Notes (Signed)
SLEEP MEDICINE CLINIC   Provider:  Larey Seat, M D  Referring Provider: Crist Infante, MD Primary Care Physician:  Jerlyn Ly, MD  Chief Complaint  Patient presents with  . Fatigue    Rm 11. Sleep Consult. Patient states that she had a sleep study several years ago and never had sleep study.     HPI:  Kaitlyn Norman is a 65 y.o. female , seen here as a referral from Dr. Joylene Draft for a sleep consultation,  [name] had indeed been evaluated for possible sleep apnea, about 20 years ago.She never had a consultation but she underwent a sleep study, and was told afterwards that she had a very brief sleep latency and went into deepest sleep right away. She snored and has done for 20 plus year.  Apparently, no apnea was identified or no significant sleep disorder breathing was noted she was not asked to return for any treatment. She feels her sleep is not refreshing and restorative, and she wakes with a feeling that she craves more sleep.  Her husband is freshly retired, and she shares his 7 AM natural wake up time, but is not "ready to go". When she  was a teenager, she would be going to bed by 10 PM and was delighted to go to sleep, the same during her college years.  Sleep habits are as follows: [name] is a very early sleeper, going to bed at about 8:30 PM and often feeling already ready to sleep for hours. She is promptly asleep once in bed, the bedroom is cool, quiet and dark. She sleeps on one pillow only, she usually sleeps supine with 1 arm above the head. She also reports that she is very hot at night her body temperature seems to be higher than her husband's,for example. She denies hot flashes or diaphoresis at night . Some nights she will have a bathroom break but not never multiple. Between 5:30 AM and 7 when she rises she feels that she gets the deepest sleep of the whole night. She also reports vivid dreams but not nightmares, no upsetting character to her dreams. Her  inner clock wakes her at 7 spontaneously. While her husband was still working or when the children were younger she would have to rise earlier, but she never likes to be up early. She would never hear his beeper or alarm clock.  Average sleep time 9 hours straight.   Sleep medical history and family sleep history: She slept early all her life. Her family did not watch TV.   Social history:  ETOH , social 2-3 glasses of wine, not daily.  Caffeine , 1 cup some mornings but every morning, no soda or ice tea intake. She smoked some during her college years but not since age 15.  Review of Systems: Out of a complete 14 system review, the patient complains of only the following symptoms, and all other reviewed systems are negative. Epworth score 15  , Fatigue severity score 18  , depression score 1/15   How likely are you to doze in the following situations: 0 = not likely, 1 = slight chance, 2 = moderate chance, 3 = high chance  Sitting and Reading?3 Watching Television?3 Sitting inactive in a public place (theater or meeting)?3 As a passenger in a car for an hour without a break?0  Lying down in the afternoon when circumstances permit?3 Sitting and talking to someone?0 Sitting quietly after lunch without alcohol?3 In a car, while stopped  for a few minutes in traffic?0  Total = 15     Social History   Social History  . Marital status: Married    Spouse name: N/A  . Number of children: 2  . Years of education: Grad   Occupational History  . Not on file.   Social History Main Topics  . Smoking status: Former Research scientist (life sciences)  . Smokeless tobacco: Never Used     Comment: Quit 1972  . Alcohol use 3.6 oz/week    6 Glasses of wine per week  . Drug use: No  . Sexual activity: Yes    Birth control/ protection: Post-menopausal   Other Topics Concern  . Not on file   Social History Narrative  . No narrative on file    Family History  Problem Relation Age of Onset  . Lung cancer  Mother   . Hypertension Mother   . Cancer Mother     LUNG CANCER  . Osteopenia Mother   . Prostate cancer Father   . Cancer Father     PROSTATE CANCER  . Cancer Brother     THROAT CANCER  . Diabetes Paternal Uncle   . Colon cancer Neg Hx     Past Medical History:  Diagnosis Date  . Asthma     Past Surgical History:  Procedure Laterality Date  . EXCISION OF ENLARGED LYMPH NODE    . GYNECOLOGIC CRYOSURGERY      Current Outpatient Prescriptions  Medication Sig Dispense Refill  . nitroGLYCERIN (NITRODUR - DOSED IN MG/24 HR) 0.2 mg/hr patch Use 1/4 patch daily to the affected area 30 patch 1   No current facility-administered medications for this visit.     Allergies as of 07/28/2016  . (No Known Allergies)    Vitals: BP 114/82   Pulse 70   Resp 16   Ht 5\' 6"  (1.676 m)   Wt 198 lb (89.8 kg)   BMI 31.96 kg/m  Last Weight:  Wt Readings from Last 1 Encounters:  07/28/16 198 lb (89.8 kg)   [id number] mass index is 31.96 kg/m.     Last Height:   Ht Readings from Last 1 Encounters:  07/28/16 5\' 6"  (1.676 m)    Physical exam:  General: The patient is awake, alert and appears not in acute distress. The patient is well groomed. Head: Normocephalic, atraumatic. Neck is supple. Mallampati3  neck circumference 15.25 . Nasal airflow patent, TMJ click or dislocation  is not evident . Retrognathia is not seen.  Cardiovascular:  Regular rate and rhythm, without  murmurs or carotid bruit, and without distended neck veins. Respiratory: Lungs are clear to auscultation. Skin:  Without evidence of edema, or rash Trunk: BMI is 32. The patient's posture is erect   Neurologic exam : The patient is awake and alert, oriented to place and time.   Memory subjective described as intact.  Attention span & concentration ability appears normal.  Speech is fluent,  without dysarthria, dysphonia or aphasia.  Mood and affect are appropriate.  Cranial nerves: Pupils are equal and briskly  reactive to light. Funduscopic exam without evidence of pallor or edema.  Extraocular movements  in vertical and horizontal planes intact and without nystagmus. Visual fields by finger perimetry are intact. Hearing to finger rub intact.   Facial sensation intact to fine touch.  Facial motor strength is symmetric and tongue and uvula move midline. Shoulder shrug was symmetrical.   Motor exam:  Normal tone, muscle bulk and symmetric strength in all  extremities. Sensory:  Fine touch, pinprick and vibration were tested in all extremities. Proprioception tested in the upper extremities was normal. Coordination: Rapid alternating movements in the fingers/hands was normal. Finger-to-nose maneuver  normal without evidence of ataxia, dysmetria or tremor. Gait and station: Patient walks without assistive device and is able unassisted to climb up to the exam table. Strength within normal limits.  Stance is stable and normal.   Deep tendon reflexes: in the  upper and lower extremities are symmetric and intact. Babinski maneuver response is downgoing.  The patient was advised of the nature of the diagnosed sleep disorder , the treatment options and risks for general a health and wellness arising from not treating the condition.  I spent more than 40 minutes of face to face time with the patient. Greater than 50% of time was spent in counseling and coordination of care. We have discussed the diagnosis and differential and I answered the patient's questions.     Assessment:  After physical and neurologic examination, review of laboratory studies,  Personal review of imaging studies, reports of other /same  Imaging studies ,  Results of previous polysomnography/ neurophysiology testing and pre-existing records as far as provided in visit.,   My assessment is   1) snoring , exacerbated by weight gain, alcohol intake and sleep position.   2) fatigue and excessive daytime sleepiness, slowly increasing over the  last decade. Non restorative sleep.   3)obesity  4) GERD    Plan:  Treatment plan and additional workup :  Mrs. Condrey has overall always been easy to go to bed easy to sleep and had no problem with sleeping through the night. New is that she doesn't feel refreshed or restored as she used to be. She does not wake with headaches sometimes may have dry mouth she does not have nocturia. She snores loudly according to her husband. We're looking to rule out apnea in this patient. If only mild apnea is found associated with loud snoring, I would certainly consider a dental device rather than CPAP for. If her apnea is REM dependent, associated with low oxygen levels, in that case we would need to use CPAP. At this time I'm not sure that she has apnea at all and her husband is not sure either. If it comes to snoring treatments alone weight loss, sleep position, and dental devices can be used to treat snoring. Apnea should not be ignored as it has so many comorbidities, given that she is really excessively daytime sleepy, I would like to have this evaluation as an in lab sleep study.   Asencion Partridge Shonice Wrisley MD  07/28/2016   CC: Crist Infante, [place],  [id number]

## 2016-07-28 NOTE — Patient Instructions (Signed)

## 2016-09-20 DIAGNOSIS — D1801 Hemangioma of skin and subcutaneous tissue: Secondary | ICD-10-CM | POA: Diagnosis not present

## 2016-09-20 DIAGNOSIS — L820 Inflamed seborrheic keratosis: Secondary | ICD-10-CM | POA: Diagnosis not present

## 2016-09-20 DIAGNOSIS — L853 Xerosis cutis: Secondary | ICD-10-CM | POA: Diagnosis not present

## 2016-09-20 DIAGNOSIS — D225 Melanocytic nevi of trunk: Secondary | ICD-10-CM | POA: Diagnosis not present

## 2016-09-20 DIAGNOSIS — D2272 Melanocytic nevi of left lower limb, including hip: Secondary | ICD-10-CM | POA: Diagnosis not present

## 2016-09-20 DIAGNOSIS — D2271 Melanocytic nevi of right lower limb, including hip: Secondary | ICD-10-CM | POA: Diagnosis not present

## 2016-09-20 DIAGNOSIS — L814 Other melanin hyperpigmentation: Secondary | ICD-10-CM | POA: Diagnosis not present

## 2016-09-20 DIAGNOSIS — L821 Other seborrheic keratosis: Secondary | ICD-10-CM | POA: Diagnosis not present

## 2016-09-20 DIAGNOSIS — D2261 Melanocytic nevi of right upper limb, including shoulder: Secondary | ICD-10-CM | POA: Diagnosis not present

## 2016-09-20 DIAGNOSIS — D2262 Melanocytic nevi of left upper limb, including shoulder: Secondary | ICD-10-CM | POA: Diagnosis not present

## 2016-09-21 ENCOUNTER — Ambulatory Visit (INDEPENDENT_AMBULATORY_CARE_PROVIDER_SITE_OTHER): Payer: PPO | Admitting: Neurology

## 2016-09-21 DIAGNOSIS — G4710 Hypersomnia, unspecified: Secondary | ICD-10-CM | POA: Diagnosis not present

## 2016-09-21 DIAGNOSIS — G4730 Sleep apnea, unspecified: Secondary | ICD-10-CM

## 2016-09-21 DIAGNOSIS — G4719 Other hypersomnia: Secondary | ICD-10-CM

## 2016-09-21 DIAGNOSIS — E6609 Other obesity due to excess calories: Secondary | ICD-10-CM

## 2016-09-21 DIAGNOSIS — R0683 Snoring: Secondary | ICD-10-CM

## 2016-09-27 ENCOUNTER — Telehealth: Payer: Self-pay | Admitting: Neurology

## 2016-09-27 DIAGNOSIS — G4733 Obstructive sleep apnea (adult) (pediatric): Secondary | ICD-10-CM

## 2016-09-27 NOTE — Procedures (Signed)
PATIENT'S NAME:  Kaitlyn Norman, Kaitlyn Norman DOB:      May 28, 1951      MR#:    [id number]     DATE OF RECORDING: 09/21/2016 REFERRING M.D.:  Crist Infante MD Study Performed:   Baseline Polysomnogram HISTORY: Kaitlyn Norman is a 66 year old female, seen here as a referral from Dr. Joylene Draft for a sleep evaluation. Kaitlyn Norman had been evaluated 20 years ago for possible sleep apnea. She never had a consultation but she underwent a sleep study, and was told afterwards that she had a very brief sleep latency and went into deepest sleep right away. She snored and has done for 20 plus year.   A  significant sleep disordered breathing was not noted, she was not asked to return for any treatment. She feels her sleep is not refreshing and restorative, and she wakes with a feeling that she craves more sleep.  Hypersomnia , Snoring and Asthma.  The patient endorsed the Epworth Sleepiness Scale at 15/24 points.   The patient's weight 198 pounds with a height of 66 (inches), resulting in a BMI of 31.9 kg/m2. The patient's neck circumference measured 15.2 inches.  CURRENT MEDICATIONS: Nitroglycerin.   PROCEDURE:  This is a multichannel digital polysomnogram utilizing the Somnostar 11.2 system.  Electrodes and sensors were applied and monitored per AASM Specifications.   EEG, EOG, Chin and Limb EMG, were sampled at 200 Hz.  ECG, Snore and Nasal Pressure, Thermal Airflow, Respiratory Effort, CPAP Flow and Pressure, Oximetry was sampled at 50 Hz. Digital video and audio were recorded.      BASELINE STUDY  Lights Out was at 21:06 and Lights On at 04:54.  Total recording time (TRT) was 469 minutes, with a total sleep time (TST) of  408.5 minutes.   The patient's sleep latency was 28 minutes.  REM latency was 70.5 minutes.  The sleep efficiency was 87.1 %.     SLEEP ARCHITECTURE: WASO (Wake after sleep onset) was 32 minutes.  There were 36.5 minutes in Stage N1, 243.5 minutes Stage N2, 78.5 minutes Stage N3 and 50 minutes in  Stage REM.  The percentage of Stage N1 was 8.9%, Stage N2 was 59.6%, Stage N3 was 19.2% and Stage R (REM sleep) was 12.2%.    RESPIRATORY ANALYSIS:  There were a total of 156 respiratory events:  81 obstructive apneas, 1 central apneas and 0 mixed apneas with a total of 82 apneas and an apnea index (AI) of 12. /hour. There were 74 hypopneas with a hypopnea index of 10.9 /hour. The patient also had 0 respiratory event related arousals (RERAs).      The total APNEA/HYPOPNEA INDEX (AHI) was 22.9/hour and the total RESPIRATORY DISTURBANCE INDEX was 22.9 /hour.  48 events occurred in REM sleep and 105 events in NREM. The REM AHI was 57.6 /hour, versus a non-REM AHI of 18.1. The patient spent 36.5 minutes of total sleep time in the supine position and 372 minutes in non-supine. The supine AHI was 55.9 versus a non-supine AHI of 19.7.  OXYGEN SATURATION & C02:  The Wake baseline 02 saturation was 94%, with the lowest being 82%. Time spent below 89% saturation equaled 7 minutes. Co2 was not retained above 40 torr.    PERIODIC LIMB MOVEMENTS:   The patient had a total of 51 Periodic Limb Movements.  The Periodic Limb Movement (PLM) index was 7.5 and the PLM Arousal index was 0.3/hour. The arousals were noted as: 51 were spontaneous, 2 were associated with  PLMs, and 34 were associated with respiratory events. Audio and video analysis did not show any abnormal or unusual movements, behaviors, phonations or vocalizations. The patient took bathroom breaks. Snoring was noted. EKG was in keeping with normal sinus rhythm (NSR).  Post-study, the patient indicated that sleep was better than usual.    IMPRESSION:  Severe Obstructive sleep apnea, with supine accentuation.  AHI general was 18.1 and in supine sleep was 55.9 /hr. with loud snoring.  Mild hypoxemia noted , which was clinically insignificant.  PLM clinically insignificant    RECOMMENDATIONS:  1. Advise full-night, attended, CPAP titration study to  optimize therapy.   2. Advise to lose weight, diet and exercise if not contraindicated (BMI 32) 3. Further information regarding OSA may be obtained from [employer] (www.sleepfoundation.org) or American Sleep Apnea Association (www.sleepapnea.org). 4. A follow up appointment will be scheduled in the Sleep Clinic at [facility]. The referring provider will be notified of the results.      I certify that I have reviewed the entire raw data recording prior to the issuance of this report in accordance with the Standards of Accreditation of the American Academy of Sleep Medicine (AASM)    Larey Seat, MD  09-27-2016  Diplomat, American Board of Psychiatry and Neurology  Diplomat, American Board of Sleep Medicine Medical Director of  [facility] , an AASM accredited facility

## 2016-09-28 NOTE — Telephone Encounter (Signed)
Patient called back. I was able to give results. Patient asks to get titration study done as soon as possible. She has a grandchild that is due this month. I advised her that I will Dawn know.

## 2016-09-28 NOTE — Telephone Encounter (Signed)
Pt returned RN's call °

## 2016-09-28 NOTE — Telephone Encounter (Signed)
LM on both numbers for patient to call back for sleep study results.

## 2016-09-28 NOTE — Telephone Encounter (Signed)
-----   Message from Larey Seat, MD sent at 09/27/2016  4:48 PM EST ----- IMPRESSION:  Severe Obstructive sleep apnea, with supine accentuation.  AHI general was 18.1 and in supine sleep was 55.9 /hr. with loud snoring.  Mild hypoxemia noted, which was clinically insignificant.  PLM clinically insignificant    RECOMMENDATIONS:  1. Advise full-night, attended, CPAP titration study to optimize therapy.   2. Advise to lose weight, diet and exercise if not contraindicated (BMI 32) 3. Further information regarding OSA may be obtained from [employer] (www.sleepfoundation.org) or American Sleep Apnea Association (www.sleepapnea.org).

## 2016-09-28 NOTE — Telephone Encounter (Signed)
LM on both number to call back.

## 2016-10-05 ENCOUNTER — Telehealth: Payer: Self-pay | Admitting: Neurology

## 2016-10-05 NOTE — Telephone Encounter (Signed)
Please ask her to return. CD

## 2016-10-06 NOTE — Telephone Encounter (Signed)
Patient has titration study scheduled for this Monday

## 2016-10-11 ENCOUNTER — Ambulatory Visit (INDEPENDENT_AMBULATORY_CARE_PROVIDER_SITE_OTHER): Payer: PPO | Admitting: Neurology

## 2016-10-11 DIAGNOSIS — G4733 Obstructive sleep apnea (adult) (pediatric): Secondary | ICD-10-CM | POA: Diagnosis not present

## 2016-10-15 ENCOUNTER — Telehealth: Payer: Self-pay | Admitting: Neurology

## 2016-10-15 DIAGNOSIS — G4733 Obstructive sleep apnea (adult) (pediatric): Secondary | ICD-10-CM

## 2016-10-15 NOTE — Procedures (Signed)
PATIENT'S NAME:  Kaitlyn Norman, Kaitlyn Norman DOB:      [birth year]      MR#:    [id number]     DATE OF RECORDING: 10/11/2016 REFERRING M.D.:  Crist Infante MD Study Performed:   CPAP  Titration HISTORY: Kaitlyn Norman is a 66 y.o. female patient of Dr. Silvestre Mesi who underwent a sleep study on 09-21-2016 at [facility], which revealed an AHI of  22.9/hr. , REM AHI of 57.6/hr. , supine Ahi at 55.9/hr., and nadir SpO2 at 82% - without significant time in desaturation.   The patient endorsed the Epworth Sleepiness Scale at 15/24.The patient's weight 198 pounds with a height of 66 (inches), resulting in a BMI of 31.9 kg/m2.The patient's neck circumference measured 15 inches.  CURRENT MEDICATIONS: Nitroglycerin.  PROCEDURE:  This is a multichannel digital polysomnogram utilizing the SomnoStar 11.2 system.  Electrodes and sensors were applied and monitored per AASM Specifications.   EEG, EOG, Chin and Limb EMG, were sampled at 200 Hz.  ECG, Snore and Nasal Pressure, Thermal Airflow, Respiratory Effort, CPAP Flow and Pressure, Oximetry was sampled at 50 Hz. Digital video and audio were recorded.      CPAP was initiated at 5.0cmH20 with heated humidity per AASM split night standards and pressure was advanced to 9 cmH20 because of hypopneas, apneas and desaturations.  At a PAP pressure of 9.0 cmH20, there was a reduction of the AHI to 0.0 with improvement of obstructive sleep apnea.    Lights Out was at 20:35 and Lights On at 04:57. Total recording time (TRT) was 502.5 minutes, with a total sleep time (TST) of 374.5 minutes. The patient's sleep latency was 107 minutes with 1 minutes of wake time after sleep onset. REM latency was 104.5 minutes.  The sleep efficiency was 74.5 %.   SLEEP ARCHITECTURE: WASO (Wake after sleep onset) was 28 minutes.  There were 12 minutes in Stage N1, 192.5 minutes Stage N2, 64.5 minutes Stage N3 and 105.5 minutes in Stage REM.  The percentage of Stage N1 was 3.2%, Stage N2 was 51.4%,  Stage N3 was 17.2% and Stage R (REM sleep) was 28.2%.   RESPIRATORY ANALYSIS:  There were 5 respiratory events: 0 apneas and 5 hypopneas with a hypopnea index of 0.8/hour. The patient also had 0 respiratory event related arousals (RERAs).     The total APNEA/HYPOPNEA INDEX (AHI) was 0.8 /hour and the total RESPIRATORY DISTURBANCE INDEX was 0.8/ hr.  0 events occurred in REM sleep and 5 events in NREM. The REM AHI was 0.0 /hour versus a non-REM AHI of 1.1 /hour.  The patient spent 354 minutes of total sleep time in the supine position and 21 minutes in non-supine. The supine AHI was 0.8, versus a non-supine AHI of 0.0.  OXYGEN SATURATION & C02:  The baseline 02 saturation was 94%, with the lowest being 90%. Time spent below 89% saturation equaled 0.0 minutes.  PERIODIC LIMB MOVEMENTS:   The patient had a total of 8 Periodic Limb Movements. The Periodic Limb Movement (PLM) index was 1.3 and the PLM Arousal index was 0.0 /hr.       DIAGNOSIS Obstructive Sleep Apnea, responding to CPAP at 9 cm water pressure. 1. The patient was fitted with a Resmed Airfit p10 in medium pillow size.   PLANS/RECOMMENDATIONS: Start CPAP therapy with heated humidity, at 9 cm water pressure and with above named interface, RV in 30-60 days at [facility] sleep clinic. Please issue an air-sense or dream station machine.  A follow up appointment will be scheduled in the Sleep Clinic at [facility].   Please call [phone] with any questions.      I certify that I have reviewed the entire raw data recording prior to the issuance of this report in accordance with the Standards of Accreditation of the American Academy of Sleep Medicine (AASM)    Larey Seat, M.D.  10-15-2016 Diplomat, American Board of Psychiatry and Neurology  Diplomat, [employer] of [employer] Medicine Medical Director, [place] [facility]

## 2016-10-19 NOTE — Telephone Encounter (Signed)
Patient called office in reference to see if sleep study results were ready.  Please call

## 2016-10-20 NOTE — Telephone Encounter (Signed)
-----   Message from Larey Seat, MD sent at 10/15/2016  4:13 PM EST ----- Obstructive Sleep Apnea, responding to CPAP at 9 cm water pressure. 1. The patient was fitted with a Resmed Airfit p10 in medium pillow size.   PLANS/RECOMMENDATIONS: Start CPAP therapy with heated humidity, at 9 cm water pressure and with above named interface, RV in 30-60 days at [facility] sleep clinic. Please issue an air-sense or dream station machine.

## 2016-10-20 NOTE — Telephone Encounter (Addendum)
I called pt, no answer, left a message asking him to call her to call me back.

## 2016-10-20 NOTE — Telephone Encounter (Signed)
I LM for patient giving results and recommendations. Patient told me during 1st phone call that she was anxious to get started. So I advised on vm that I will go ahead and send orders to [facility] and she will get a letter with all of this information. I also asked for her to call back and make f/u appt end of May/begining of June.

## 2016-10-20 NOTE — Telephone Encounter (Signed)
It appears that Kaitlyn Norman has already called pt to discuss sleep study results. If pt calls back with any questions, I will be glad to talk with her.

## 2016-11-17 DIAGNOSIS — G4733 Obstructive sleep apnea (adult) (pediatric): Secondary | ICD-10-CM | POA: Diagnosis not present

## 2016-11-29 ENCOUNTER — Other Ambulatory Visit: Payer: Self-pay | Admitting: Obstetrics and Gynecology

## 2016-11-29 DIAGNOSIS — Z1231 Encounter for screening mammogram for malignant neoplasm of breast: Secondary | ICD-10-CM

## 2016-12-17 DIAGNOSIS — G4733 Obstructive sleep apnea (adult) (pediatric): Secondary | ICD-10-CM | POA: Diagnosis not present

## 2017-01-12 ENCOUNTER — Encounter: Payer: Self-pay | Admitting: Neurology

## 2017-01-14 ENCOUNTER — Ambulatory Visit
Admission: RE | Admit: 2017-01-14 | Discharge: 2017-01-14 | Disposition: A | Discharge status: Home / Self Care | Payer: PPO | Source: Ambulatory Visit | Attending: Obstetrics and Gynecology | Admitting: Obstetrics and Gynecology

## 2017-01-14 DIAGNOSIS — Z1231 Encounter for screening mammogram for malignant neoplasm of breast: Secondary | ICD-10-CM | POA: Diagnosis not present

## 2017-01-17 ENCOUNTER — Encounter: Payer: Self-pay | Admitting: Neurology

## 2017-01-17 ENCOUNTER — Ambulatory Visit (INDEPENDENT_AMBULATORY_CARE_PROVIDER_SITE_OTHER): Payer: PPO | Admitting: Neurology

## 2017-01-17 VITALS — BP 135/79 | HR 73 | Resp 20 | Ht 66.0 in | Wt 204.0 lb

## 2017-01-17 DIAGNOSIS — G4733 Obstructive sleep apnea (adult) (pediatric): Secondary | ICD-10-CM

## 2017-01-17 DIAGNOSIS — Z9989 Dependence on other enabling machines and devices: Secondary | ICD-10-CM

## 2017-01-17 NOTE — Progress Notes (Signed)
SLEEP MEDICINE CLINIC   Provider:  Larey Seat, M D  Referring Provider: Crist Infante, MD Primary Care Physician:  Crist Infante, MD  Chief Complaint  Patient presents with  . Follow-up    cpap, having nosebleeds    HPI: For a revisit on 01/17/2017 following her recent sleep study. Kaitlyn Norman underwent a baseline polysomnography on 09/21/2016 and was diagnosed with moderate severe obstructive sleep apnea. During REM sleep her AHI was exacerbated to 57.6 per hour and in supine sleep to 55.9. She also had some periodic limb movements but no related arousals. Loud snoring was noted and mild hypoxemia. I recommended a CPAP titration given that REM dependent nature of her apnea. The patient was titrated to 9 cm water pressure with a nasal pillow. I'm meeting her for the first time now since she started CPAP. She has been a very compliant patient was 87% compliance but her average usage time is only 5 hours 58 minutes. She explained that her husband just underwent a knee replacement surgery 4 weeks ago and she is often getting up with him so her sleep is not as uninterrupted as it could be. CPAP is set at 9 cm water pressure with 3 cm EPR and her residual apnea index is now 1.0. She mentioned that the nasal pillow is not the most comfortable option for her, her nostrils get dry she even had epistaxis, and she feels that the nasal problems are pushing her nose up she sees a swelling and redness at the bridge of the nose every morning.   Kaitlyn Norman is a 66 y.o. female , seen here as a referral from Dr. Joylene Draft for a sleep consultation, Kaitlyn Norman had indeed been evaluated for possible sleep apnea, about 20 years ago.She never had a consultation but she underwent a sleep study, and was told afterwards that she had a very brief sleep latency and went into deepest sleep right away. She snored and has done for 20 plus year.  Apparently, no apnea was identified or no significant sleep disorder  breathing was noted she was not asked to return for any treatment. She feels her sleep is not refreshing and restorative, and she wakes with a feeling that she craves more sleep.  Her husband is freshly retired, and she shares his 7 AM natural wake up time, but is not "ready to go". When she  was a teenager, she would be going to bed by 10 PM and was delighted to go to sleep, the same during her college years.  Sleep habits are as follows: Kaitlyn Norman is a very early sleeper, going to bed at about 8:30 PM and often feeling already ready to sleep for hours. She is promptly asleep once in bed, the bedroom is cool, quiet and dark. She sleeps on one pillow only, she usually sleeps supine with 1 arm above the head. She also reports that she is very hot at night her body temperature seems to be higher than her husband's,for example. She denies hot flashes or diaphoresis at night . Some nights she will have a bathroom break but not never multiple. Between 5:30 AM and 7 when she rises she feels that she gets the deepest sleep of the whole night. She also reports vivid dreams but not nightmares, no upsetting character to her dreams. Her inner clock wakes her at 7 spontaneously. While her husband was still working or when the children were younger she would have to rise earlier, but she never  likes to be up early. She would never hear his beeper or alarm clock.  Average sleep time 9 hours straight.   Sleep medical history and family sleep history: She slept early all her life. Her family did not watch TV.  Social history: ETOH , social 2-3 glasses of wine, not daily.  Caffeine , 1 cup some mornings but every morning, no soda or ice tea intake. She smoked some during her college years but not since age 78.  Review of Systems: Out of a complete 14 system review, the patient complains of only the following symptoms, and all other reviewed systems are negative. Epworth score 15  , Fatigue severity score still 18   , depression score 1/15   How likely are you to doze in the following situations: 0 = not likely, 1 = slight chance, 2 = moderate chance, 3 = high chance  Sitting and Reading?1 Watching Television?1 Sitting inactive in a public place (theater or meeting)?1 As a passenger in a car for an hour without a break?1  Lying down in the afternoon when circumstances permit?3 Sitting and talking to someone?0 Sitting quietly after lunch without alcohol?2 In a car, while stopped for a few minutes in traffic?0  Total =  9 from 15     Social History   Social History  . Marital status: Married    Spouse name: N/A  . Number of children: 2  . Years of education: Grad   Occupational History  . Not on file.   Social History Main Topics  . Smoking status: Former Research scientist (life sciences)  . Smokeless tobacco: Never Used     Comment: Quit 1972  . Alcohol use 3.6 oz/week    6 Glasses of wine per week  . Drug use: No  . Sexual activity: Yes    Birth control/ protection: Post-menopausal   Other Topics Concern  . Not on file   Social History Narrative  . No narrative on file    Family History  Problem Relation Age of Onset  . Lung cancer Mother   . Hypertension Mother   . Cancer Mother        LUNG CANCER  . Osteopenia Mother   . Prostate cancer Father   . Cancer Father        PROSTATE CANCER  . Cancer Brother        THROAT CANCER  . Diabetes Paternal Uncle   . Colon cancer Neg Hx     Past Medical History:  Diagnosis Date  . Asthma     Past Surgical History:  Procedure Laterality Date  . EXCISION OF ENLARGED LYMPH NODE    . GYNECOLOGIC CRYOSURGERY      No current outpatient prescriptions on file.   No current facility-administered medications for this visit.     Allergies as of 01/17/2017  . (No Known Allergies)    Vitals: BP 135/79   Pulse 73   Resp 20   Ht 5\' 6"  (1.676 m)   Wt 204 lb (92.5 kg)   BMI 32.93 kg/m  Last Weight:  Wt Readings from Last 1 Encounters:    01/17/17 204 lb (92.5 kg)   IRW:ERXV mass index is 32.93 kg/m.     Last Height:   Ht Readings from Last 1 Encounters:  01/17/17 5\' 6"  (1.676 m)    Physical exam:  General: The patient is awake, alert and appears not in acute distress. The patient is well groomed. Head: Normocephalic, atraumatic. Neck is supple. Mallampati3  neck circumference 15.25 . Nasal airflow patent, TMJ click or dislocation  is not evident . Retrognathia is not seen.  Cardiovascular:  Regular rate and rhythm, without  murmurs or carotid bruit, and without distended neck veins.  Neurologic exam : The patient is awake and alert, oriented to place and time.   Mood and affect are appropriate. Cranial nerves:Pupils are equal and briskly reactive to light. Funduscopic exam without evidence of pallor or edema.  Extraocular movements  in vertical and horizontal planes intact and without nystagmus. Visual fields by finger perimetry are intact. Hearing to finger rub intact. Facial sensation intact to fine touch.Facial motor strength is symmetric and tongue and uvula move midline. Shoulder shrug was symmetrical.  Motor exam:  Normal tone, muscle bulk and symmetric strength in all extremities.   The patient was advised of the nature of the diagnosed sleep disorder , the treatment options and risks for general a health and wellness arising from not treating the condition.  I spent more than 20 minutes of face to face time with the patient. Greater than 50% of time was spent in counseling and coordination of care. We have discussed the diagnosis and differential and I answered the patient's questions.  I found an alternative nasal pillow interface for the patient, using a model made by [employer], a medium size [place] with head gear.  I like for her to try this out, and if this is more comfortable over a run of the next 14 days or so I would be changing her prescription to this model to that she can get new liners.     Assessment:  After physical and neurologic examination, review of laboratory studies,  Personal review of imaging studies, reports of other /same  Imaging studies ,  Results of previous polysomnography/ neurophysiology testing and pre-existing records as far as provided in visit.,   My assessment is   1) snoring , apnea -  exacerbated by weight gain, alcohol intake and sleep position. Moderate - severe OSA diagnosis.  On CPAP  9 cm with 3 cm water EPR. Reduction in sleepiness.   Plan:  Continue CPAP- 6 month RV and yearly after that. Adjust humidity.    Asencion Partridge Lucile Didonato MD  01/17/2017   CC: Crist Infante, [address] [place], [place] [address]

## 2017-02-09 ENCOUNTER — Telehealth: Payer: Self-pay | Admitting: Neurology

## 2017-02-09 DIAGNOSIS — G4733 Obstructive sleep apnea (adult) (pediatric): Secondary | ICD-10-CM

## 2017-02-09 DIAGNOSIS — Z9989 Dependence on other enabling machines and devices: Secondary | ICD-10-CM

## 2017-02-09 NOTE — Telephone Encounter (Signed)
The patient had been fitted with a medium size Eason nasal mask by [employer]  and would like to receive this mask the fitting headgear and any necessary adapters through her D ME,  [facility]

## 2017-02-10 NOTE — Telephone Encounter (Signed)
Order sent to [facility] per request.

## 2017-03-19 DIAGNOSIS — G4733 Obstructive sleep apnea (adult) (pediatric): Secondary | ICD-10-CM | POA: Diagnosis not present

## 2017-03-25 DIAGNOSIS — G4733 Obstructive sleep apnea (adult) (pediatric): Secondary | ICD-10-CM | POA: Diagnosis not present

## 2017-04-19 DIAGNOSIS — G4733 Obstructive sleep apnea (adult) (pediatric): Secondary | ICD-10-CM | POA: Diagnosis not present

## 2017-05-04 DIAGNOSIS — Z23 Encounter for immunization: Secondary | ICD-10-CM | POA: Diagnosis not present

## 2017-05-06 DIAGNOSIS — H43811 Vitreous degeneration, right eye: Secondary | ICD-10-CM | POA: Diagnosis not present

## 2017-05-19 DIAGNOSIS — G4733 Obstructive sleep apnea (adult) (pediatric): Secondary | ICD-10-CM | POA: Diagnosis not present

## 2017-05-24 DIAGNOSIS — Z01419 Encounter for gynecological examination (general) (routine) without abnormal findings: Secondary | ICD-10-CM | POA: Diagnosis not present

## 2017-05-24 DIAGNOSIS — Z6833 Body mass index (BMI) 33.0-33.9, adult: Secondary | ICD-10-CM | POA: Diagnosis not present

## 2017-06-07 DIAGNOSIS — H524 Presbyopia: Secondary | ICD-10-CM | POA: Diagnosis not present

## 2017-06-07 DIAGNOSIS — H40053 Ocular hypertension, bilateral: Secondary | ICD-10-CM | POA: Diagnosis not present

## 2017-06-19 DIAGNOSIS — G4733 Obstructive sleep apnea (adult) (pediatric): Secondary | ICD-10-CM | POA: Diagnosis not present

## 2017-07-19 DIAGNOSIS — G4733 Obstructive sleep apnea (adult) (pediatric): Secondary | ICD-10-CM | POA: Diagnosis not present

## 2017-07-21 ENCOUNTER — Ambulatory Visit: Payer: PPO | Admitting: Neurology

## 2017-08-19 DIAGNOSIS — G4733 Obstructive sleep apnea (adult) (pediatric): Secondary | ICD-10-CM | POA: Diagnosis not present

## 2017-08-22 ENCOUNTER — Telehealth: Payer: Self-pay | Admitting: Neurology

## 2017-08-22 NOTE — Telephone Encounter (Signed)
Called the patient from the wait list and was going to offer available apt for tomorrow 08/23/2017. If patient calls back please feel free to see if she would like them if those apt are still available

## 2017-09-12 ENCOUNTER — Encounter: Payer: Self-pay | Admitting: Neurology

## 2017-09-19 DIAGNOSIS — G4733 Obstructive sleep apnea (adult) (pediatric): Secondary | ICD-10-CM | POA: Diagnosis not present

## 2017-10-13 ENCOUNTER — Ambulatory Visit: Payer: PPO | Admitting: Neurology

## 2017-10-17 DIAGNOSIS — G4733 Obstructive sleep apnea (adult) (pediatric): Secondary | ICD-10-CM | POA: Diagnosis not present

## 2017-11-17 DIAGNOSIS — G4733 Obstructive sleep apnea (adult) (pediatric): Secondary | ICD-10-CM | POA: Diagnosis not present

## 2017-11-22 ENCOUNTER — Ambulatory Visit: Payer: PPO | Admitting: Neurology

## 2017-11-23 ENCOUNTER — Ambulatory Visit: Payer: PPO | Admitting: Neurology

## 2017-11-30 ENCOUNTER — Other Ambulatory Visit: Payer: Self-pay | Admitting: Internal Medicine

## 2017-11-30 DIAGNOSIS — Z1231 Encounter for screening mammogram for malignant neoplasm of breast: Secondary | ICD-10-CM

## 2017-12-07 DIAGNOSIS — B078 Other viral warts: Secondary | ICD-10-CM | POA: Diagnosis not present

## 2017-12-07 DIAGNOSIS — D485 Neoplasm of uncertain behavior of skin: Secondary | ICD-10-CM | POA: Diagnosis not present

## 2017-12-07 DIAGNOSIS — D225 Melanocytic nevi of trunk: Secondary | ICD-10-CM | POA: Diagnosis not present

## 2017-12-07 DIAGNOSIS — G4733 Obstructive sleep apnea (adult) (pediatric): Secondary | ICD-10-CM | POA: Diagnosis not present

## 2017-12-07 DIAGNOSIS — L821 Other seborrheic keratosis: Secondary | ICD-10-CM | POA: Diagnosis not present

## 2017-12-07 DIAGNOSIS — L814 Other melanin hyperpigmentation: Secondary | ICD-10-CM | POA: Diagnosis not present

## 2017-12-07 DIAGNOSIS — D1801 Hemangioma of skin and subcutaneous tissue: Secondary | ICD-10-CM | POA: Diagnosis not present

## 2017-12-15 DIAGNOSIS — G4733 Obstructive sleep apnea (adult) (pediatric): Secondary | ICD-10-CM | POA: Diagnosis not present

## 2017-12-20 DIAGNOSIS — G4733 Obstructive sleep apnea (adult) (pediatric): Secondary | ICD-10-CM | POA: Diagnosis not present

## 2018-01-17 ENCOUNTER — Ambulatory Visit
Admission: RE | Admit: 2018-01-17 | Discharge: 2018-01-17 | Disposition: A | Discharge status: Home / Self Care | Payer: PPO | Source: Ambulatory Visit | Attending: Internal Medicine | Admitting: Internal Medicine

## 2018-01-17 DIAGNOSIS — Z1231 Encounter for screening mammogram for malignant neoplasm of breast: Secondary | ICD-10-CM

## 2018-04-20 DIAGNOSIS — G4733 Obstructive sleep apnea (adult) (pediatric): Secondary | ICD-10-CM | POA: Diagnosis not present

## 2018-05-24 DIAGNOSIS — Z23 Encounter for immunization: Secondary | ICD-10-CM | POA: Diagnosis not present

## 2018-05-30 DIAGNOSIS — Z6833 Body mass index (BMI) 33.0-33.9, adult: Secondary | ICD-10-CM | POA: Diagnosis not present

## 2018-05-30 DIAGNOSIS — Z01419 Encounter for gynecological examination (general) (routine) without abnormal findings: Secondary | ICD-10-CM | POA: Diagnosis not present

## 2018-06-16 DIAGNOSIS — G4733 Obstructive sleep apnea (adult) (pediatric): Secondary | ICD-10-CM | POA: Diagnosis not present

## 2018-08-15 DIAGNOSIS — H40053 Ocular hypertension, bilateral: Secondary | ICD-10-CM | POA: Diagnosis not present

## 2018-08-15 DIAGNOSIS — H524 Presbyopia: Secondary | ICD-10-CM | POA: Diagnosis not present

## 2018-09-04 DIAGNOSIS — E7849 Other hyperlipidemia: Secondary | ICD-10-CM | POA: Diagnosis not present

## 2018-09-04 DIAGNOSIS — D539 Nutritional anemia, unspecified: Secondary | ICD-10-CM | POA: Diagnosis not present

## 2018-09-04 DIAGNOSIS — E559 Vitamin D deficiency, unspecified: Secondary | ICD-10-CM | POA: Diagnosis not present

## 2018-09-04 DIAGNOSIS — R82998 Other abnormal findings in urine: Secondary | ICD-10-CM | POA: Diagnosis not present

## 2018-09-04 DIAGNOSIS — D538 Other specified nutritional anemias: Secondary | ICD-10-CM | POA: Diagnosis not present

## 2018-09-11 DIAGNOSIS — M859 Disorder of bone density and structure, unspecified: Secondary | ICD-10-CM | POA: Diagnosis not present

## 2018-09-11 DIAGNOSIS — Z6833 Body mass index (BMI) 33.0-33.9, adult: Secondary | ICD-10-CM | POA: Diagnosis not present

## 2018-09-11 DIAGNOSIS — N318 Other neuromuscular dysfunction of bladder: Secondary | ICD-10-CM | POA: Diagnosis not present

## 2018-09-11 DIAGNOSIS — D7589 Other specified diseases of blood and blood-forming organs: Secondary | ICD-10-CM | POA: Diagnosis not present

## 2018-09-11 DIAGNOSIS — E668 Other obesity: Secondary | ICD-10-CM | POA: Diagnosis not present

## 2018-09-11 DIAGNOSIS — R0683 Snoring: Secondary | ICD-10-CM | POA: Diagnosis not present

## 2018-09-11 DIAGNOSIS — Z0000 Encounter for general adult medical examination without abnormal findings: Secondary | ICD-10-CM | POA: Diagnosis not present

## 2018-09-11 DIAGNOSIS — K219 Gastro-esophageal reflux disease without esophagitis: Secondary | ICD-10-CM | POA: Diagnosis not present

## 2018-09-11 DIAGNOSIS — D538 Other specified nutritional anemias: Secondary | ICD-10-CM | POA: Diagnosis not present

## 2018-09-11 DIAGNOSIS — H8112 Benign paroxysmal vertigo, left ear: Secondary | ICD-10-CM | POA: Diagnosis not present

## 2018-09-11 DIAGNOSIS — E7849 Other hyperlipidemia: Secondary | ICD-10-CM | POA: Diagnosis not present

## 2018-09-11 DIAGNOSIS — E559 Vitamin D deficiency, unspecified: Secondary | ICD-10-CM | POA: Diagnosis not present

## 2018-09-12 ENCOUNTER — Other Ambulatory Visit: Payer: Self-pay | Admitting: Internal Medicine

## 2018-09-12 DIAGNOSIS — E785 Hyperlipidemia, unspecified: Secondary | ICD-10-CM

## 2018-09-13 DIAGNOSIS — Z1212 Encounter for screening for malignant neoplasm of rectum: Secondary | ICD-10-CM | POA: Diagnosis not present

## 2018-09-18 ENCOUNTER — Ambulatory Visit
Admission: RE | Admit: 2018-09-18 | Discharge: 2018-09-18 | Disposition: A | Discharge status: Home / Self Care | Payer: Self-pay | Source: Ambulatory Visit | Attending: Internal Medicine | Admitting: Internal Medicine

## 2018-09-18 DIAGNOSIS — E785 Hyperlipidemia, unspecified: Secondary | ICD-10-CM

## 2018-10-25 DIAGNOSIS — G4733 Obstructive sleep apnea (adult) (pediatric): Secondary | ICD-10-CM | POA: Diagnosis not present

## 2018-10-25 DIAGNOSIS — J40 Bronchitis, not specified as acute or chronic: Secondary | ICD-10-CM | POA: Diagnosis not present

## 2018-10-25 DIAGNOSIS — R05 Cough: Secondary | ICD-10-CM | POA: Diagnosis not present

## 2019-04-12 DIAGNOSIS — D1801 Hemangioma of skin and subcutaneous tissue: Secondary | ICD-10-CM | POA: Diagnosis not present

## 2019-04-12 DIAGNOSIS — L819 Disorder of pigmentation, unspecified: Secondary | ICD-10-CM | POA: Diagnosis not present

## 2019-04-12 DIAGNOSIS — L820 Inflamed seborrheic keratosis: Secondary | ICD-10-CM | POA: Diagnosis not present

## 2019-04-12 DIAGNOSIS — D225 Melanocytic nevi of trunk: Secondary | ICD-10-CM | POA: Diagnosis not present

## 2019-04-12 DIAGNOSIS — L814 Other melanin hyperpigmentation: Secondary | ICD-10-CM | POA: Diagnosis not present

## 2019-04-12 DIAGNOSIS — L281 Prurigo nodularis: Secondary | ICD-10-CM | POA: Diagnosis not present

## 2019-04-12 DIAGNOSIS — D2272 Melanocytic nevi of left lower limb, including hip: Secondary | ICD-10-CM | POA: Diagnosis not present

## 2019-04-12 DIAGNOSIS — D2271 Melanocytic nevi of right lower limb, including hip: Secondary | ICD-10-CM | POA: Diagnosis not present

## 2019-05-11 DIAGNOSIS — Z23 Encounter for immunization: Secondary | ICD-10-CM | POA: Diagnosis not present

## 2019-07-02 ENCOUNTER — Other Ambulatory Visit: Payer: Self-pay

## 2019-07-02 DIAGNOSIS — Z20822 Contact with and (suspected) exposure to covid-19: Secondary | ICD-10-CM

## 2019-07-04 LAB — NOVEL CORONAVIRUS, NAA: SARS-CoV-2, NAA: NOT DETECTED

## 2019-09-04 ENCOUNTER — Ambulatory Visit: Payer: PPO | Attending: Internal Medicine

## 2019-09-04 DIAGNOSIS — Z23 Encounter for immunization: Secondary | ICD-10-CM | POA: Insufficient documentation

## 2019-09-04 NOTE — Progress Notes (Signed)
   Covid-19 Vaccination Clinic  Name:  Kaitlyn Norman    MRN: [id number] DOB: April 20, 1951  09/04/2019  [name] was observed post Covid-19 immunization for 15 minutes without incidence. She was provided with Vaccine Information Sheet and instruction to access the V-Safe system.   [name] was instructed to call 911 with any severe reactions post vaccine: Marland Kitchen Difficulty breathing  . Swelling of your face and throat  . A fast heartbeat  . A bad rash all over your body  . Dizziness and weakness    Immunizations Administered    Name Date Dose VIS Date Route   Pfizer COVID-19 Vaccine 09/04/2019  4:28 PM 0.3 mL 07/27/2019 Intramuscular   Manufacturer: [place]   Lot: [id number]   [place]: [id number]

## 2019-09-06 DIAGNOSIS — E7849 Other hyperlipidemia: Secondary | ICD-10-CM | POA: Diagnosis not present

## 2019-09-06 DIAGNOSIS — M859 Disorder of bone density and structure, unspecified: Secondary | ICD-10-CM | POA: Diagnosis not present

## 2019-09-08 ENCOUNTER — Ambulatory Visit: Payer: PPO

## 2019-09-12 DIAGNOSIS — R82998 Other abnormal findings in urine: Secondary | ICD-10-CM | POA: Diagnosis not present

## 2019-09-13 DIAGNOSIS — Z0000 Encounter for general adult medical examination without abnormal findings: Secondary | ICD-10-CM | POA: Diagnosis not present

## 2019-09-13 DIAGNOSIS — Z1331 Encounter for screening for depression: Secondary | ICD-10-CM | POA: Diagnosis not present

## 2019-09-13 DIAGNOSIS — M8580 Other specified disorders of bone density and structure, unspecified site: Secondary | ICD-10-CM | POA: Diagnosis not present

## 2019-09-13 DIAGNOSIS — N318 Other neuromuscular dysfunction of bladder: Secondary | ICD-10-CM | POA: Diagnosis not present

## 2019-09-13 DIAGNOSIS — E785 Hyperlipidemia, unspecified: Secondary | ICD-10-CM | POA: Diagnosis not present

## 2019-09-13 DIAGNOSIS — D7589 Other specified diseases of blood and blood-forming organs: Secondary | ICD-10-CM | POA: Diagnosis not present

## 2019-09-13 DIAGNOSIS — E538 Deficiency of other specified B group vitamins: Secondary | ICD-10-CM | POA: Diagnosis not present

## 2019-09-13 DIAGNOSIS — G4733 Obstructive sleep apnea (adult) (pediatric): Secondary | ICD-10-CM | POA: Diagnosis not present

## 2019-09-13 DIAGNOSIS — E669 Obesity, unspecified: Secondary | ICD-10-CM | POA: Diagnosis not present

## 2019-09-13 DIAGNOSIS — D539 Nutritional anemia, unspecified: Secondary | ICD-10-CM | POA: Diagnosis not present

## 2019-09-13 DIAGNOSIS — K219 Gastro-esophageal reflux disease without esophagitis: Secondary | ICD-10-CM | POA: Diagnosis not present

## 2019-09-13 DIAGNOSIS — D72818 Other decreased white blood cell count: Secondary | ICD-10-CM | POA: Diagnosis not present

## 2019-09-14 DIAGNOSIS — Z1212 Encounter for screening for malignant neoplasm of rectum: Secondary | ICD-10-CM | POA: Diagnosis not present

## 2019-09-24 ENCOUNTER — Ambulatory Visit: Payer: PPO | Attending: Internal Medicine

## 2019-09-24 DIAGNOSIS — Z23 Encounter for immunization: Secondary | ICD-10-CM | POA: Insufficient documentation

## 2019-09-24 NOTE — Progress Notes (Signed)
   Covid-19 Vaccination Clinic  Name:  Kaitlyn Norman    MRN: [id number] DOB: Dec 19, 1950  09/24/2019  [name] was observed post Covid-19 immunization for 15 minutes without incidence. She was provided with Vaccine Information Sheet and instruction to access the V-Safe system.   [name] was instructed to call 911 with any severe reactions post vaccine: Marland Kitchen Difficulty breathing  . Swelling of your face and throat  . A fast heartbeat  . A bad rash all over your body  . Dizziness and weakness    Immunizations Administered    Name Date Dose VIS Date Route   Pfizer COVID-19 Vaccine 09/24/2019  8:43 AM 0.3 mL 07/27/2019 Intramuscular   Manufacturer: [place]   Lot: [id number]   [place]: [id number]

## 2019-09-27 ENCOUNTER — Ambulatory Visit: Payer: PPO

## 2019-10-18 DIAGNOSIS — Z01419 Encounter for gynecological examination (general) (routine) without abnormal findings: Secondary | ICD-10-CM | POA: Diagnosis not present

## 2019-10-18 DIAGNOSIS — Z6833 Body mass index (BMI) 33.0-33.9, adult: Secondary | ICD-10-CM | POA: Diagnosis not present

## 2019-11-08 ENCOUNTER — Other Ambulatory Visit: Payer: Self-pay | Admitting: Obstetrics and Gynecology

## 2019-11-08 DIAGNOSIS — Z1231 Encounter for screening mammogram for malignant neoplasm of breast: Secondary | ICD-10-CM

## 2019-11-12 DIAGNOSIS — H52203 Unspecified astigmatism, bilateral: Secondary | ICD-10-CM | POA: Diagnosis not present

## 2019-11-12 DIAGNOSIS — H40013 Open angle with borderline findings, low risk, bilateral: Secondary | ICD-10-CM | POA: Diagnosis not present

## 2019-11-19 ENCOUNTER — Ambulatory Visit
Admission: RE | Admit: 2019-11-19 | Discharge: 2019-11-19 | Disposition: A | Discharge status: Home / Self Care | Payer: PPO | Source: Ambulatory Visit

## 2019-11-19 ENCOUNTER — Other Ambulatory Visit: Payer: Self-pay

## 2019-11-19 DIAGNOSIS — Z1231 Encounter for screening mammogram for malignant neoplasm of breast: Secondary | ICD-10-CM | POA: Diagnosis not present

## 2019-12-13 ENCOUNTER — Other Ambulatory Visit: Payer: Self-pay

## 2019-12-13 ENCOUNTER — Ambulatory Visit: Payer: Self-pay

## 2019-12-13 ENCOUNTER — Encounter: Payer: Self-pay | Admitting: Sports Medicine

## 2019-12-13 ENCOUNTER — Ambulatory Visit (INDEPENDENT_AMBULATORY_CARE_PROVIDER_SITE_OTHER): Payer: PPO | Admitting: Sports Medicine

## 2019-12-13 VITALS — BP 137/70 | Ht 65.5 in | Wt 190.0 lb

## 2019-12-13 DIAGNOSIS — M25571 Pain in right ankle and joints of right foot: Secondary | ICD-10-CM

## 2019-12-13 DIAGNOSIS — D485 Neoplasm of uncertain behavior of skin: Secondary | ICD-10-CM | POA: Diagnosis not present

## 2019-12-13 DIAGNOSIS — L720 Epidermal cyst: Secondary | ICD-10-CM | POA: Diagnosis not present

## 2019-12-13 DIAGNOSIS — D225 Melanocytic nevi of trunk: Secondary | ICD-10-CM | POA: Diagnosis not present

## 2019-12-13 DIAGNOSIS — D1801 Hemangioma of skin and subcutaneous tissue: Secondary | ICD-10-CM | POA: Diagnosis not present

## 2019-12-13 MED ORDER — MELOXICAM 15 MG PO TABS: 15.0000 mg | ORAL_TABLET | Freq: Every day | ORAL | 0 refills | Status: DC

## 2019-12-13 NOTE — Progress Notes (Addendum)
Subjective:    Patient ID: Kaitlyn Norman, female    DOB: 11-Mar-1951, 69 y.o.   MRN: [id number]  HPI 69 year old female who presents for right lateral ankle pain. In the morning of 12/12/2019 she was getting up from a seated position and felt an acute onset posterior lateral ankle pain. She states that it feels like a bee sting. It is manageable at rest, but the pain is much worse when she is standing, walking, or going up stairs. Patient states that she has a great deal of pain with eversion of her foot.  The patient took 2 Advil and applied Voltaren gel which she states helps some.  She also placed a right ankle body helix.  Patient not complaining of any sensation deficits, or inability to perform any range of motion aspects with her ankle aside from the pain limitation on eversion.  Of note the patient had a trimalleolar fracture of the same foot back in 2013.  Has been doing well after this injury.  Review of Systems Per HPI    Objective:   Physical Exam General: Very pleasant 69 year old Caucasian female, no acute distress Cardiac: Skin warm and dry, palpable PT/DP right foot Respiratory: No accessory muscle use, no distress  Right foot Inspection: Notable swelling around the right lateral malleolus.  On standing both ankles in neutral position without any sign of arch collapse. Palpation: Tenderness to palpation along the course of the peroneal tendons along the posterior and inferior portions of the lateral malleolus, into the attachment in the lateral foot. Range of motion: Range of motion fully intact in all aspects aside from notable decrease in foot eversion.  Able to evert some with active motion, and full range with passive with respect to eversion Strength: 5 out of 5 strength dorsiflexion, plantarflexion.  5/5 strength to foot eversion, although there is mild pain limitation, 5/5 strength with inversion. Stability: No signs of instability, or apprehension Neurovascular: No  focal neurologic deficits, skin warm and dry, palpable PT/DP Special tests: Negative anterior drawer, negative talar tilt  Limited right ankle ultrasound Notable for profound hypoechoic area surrounding peroneus longus in the mid to distal distribution this tendon consistent with edema within the tendon sheath.  No signs of partial or complete rupture, although notable swelling makes evaluation difficult.  No swelling or fluid surrounding Achilles tendon in any distribution.  Findings are consistent with probable peroneus longus tendon strain.     Assessment & Plan:  Right lateral ankle pain Given the exam, history, and ultrasound findings patient's pain is most consistent with probable tendon strain around the peroneal tendons, longus greater than brevis.  Would like to see back on 5/4 to reevaluate once the swelling is down.  In the meantime can ice for 10 minutes 3-4 times per day.  Recommended applying patient's compression stocking to this area as much as possible.  Sent in prescription for meloxicam 15 mg, scheduled for 7 days and then as needed. -Follow-up on 5/4 for reevaluation, and repeat ultrasound -Ice for 10 minutes 3-4 times per day -Meloxicam 15 mg daily, scheduled for 7 days then as needed -Compression stocking as much as can tolerate -Can use walking boot or crutches for comfort  Guadalupe Dawn MD PGY-3 Family Medicine Resident  Patient seen and evaluated with the resident.  I agree with the above plan of care.  Patient has significant swelling around the peroneus longus tendon.  She does have active eversion of the foot though.  Treatment as above and  follow-up early next week for repeat ultrasound.  Although clinically I do not believe she has a rupture of her tendon, the degree of swelling present makes it difficult to completely rule this out.  We will repeat her ultrasound early next week.

## 2019-12-13 NOTE — Patient Instructions (Signed)
It was great seeing you today!  You have a lot of fluid around one of your peroneal tendons.  Fortunately it does not appear to be completely ruptured.  We would like to see you back on 12/18/2018 1 in the morning to repeat the ultrasound after the swelling has gone down.  In the meantime please ice the affected area for 10 minutes 3 or 4 times a day.  Compression will be very important as well, please wear your compression stockings as much as you can in the meantime.  I am sending an anti-inflammatory medication, meloxicam.  You will take a 15 mg tablet once per day.  You will take this every day for 7 days and then as needed.

## 2019-12-18 ENCOUNTER — Ambulatory Visit (INDEPENDENT_AMBULATORY_CARE_PROVIDER_SITE_OTHER): Payer: PPO | Admitting: Sports Medicine

## 2019-12-18 ENCOUNTER — Other Ambulatory Visit: Payer: Self-pay

## 2019-12-18 ENCOUNTER — Ambulatory Visit: Payer: Self-pay

## 2019-12-18 VITALS — BP 120/80 | Ht 66.0 in | Wt 190.0 lb

## 2019-12-18 DIAGNOSIS — M25571 Pain in right ankle and joints of right foot: Secondary | ICD-10-CM | POA: Diagnosis not present

## 2019-12-18 MED ORDER — NITROGLYCERIN 0.2 MG/HR TD PT24: MEDICATED_PATCH | TRANSDERMAL | 1 refills | Status: DC

## 2019-12-18 NOTE — Progress Notes (Signed)
   Subjective:    Patient ID: Kaitlyn Norman, female    DOB: 1950/10/24, 70 y.o.   MRN: [id number]  HPI   Patient comes in today for follow-up on right ankle injury suffered last week.  She is here today for repeat ultrasound.  Pain has improved but not resolved.  She decided not to wear her walking boot.  She still has persistent swelling along the lateral ankle.  She has been wearing a compression sock instead of her body helix sleeve which she found to be too uncomfortable.    Review of Systems As above    Objective:   Physical Exam  Well-developed, well-nourished.  No acute distress  Right ankle: There is still an area of focal swelling just posterior to the lateral malleolus.  No tenderness to palpation.  Full ankle range of motion.  Good strength, including good strength with resisted eversion and plantarflexion.  No ecchymosis.  Ankle is stable ligamentous exam.  Neurovascular intact distally.  Patient walks with a slightly antalgic gait but demonstrates normal foot strike pattern.  Limited MSK ultrasound of the lateral right ankle was performed.  Images were obtained in long and short axis.  Once again seen is significant edema surrounding the peroneus tendon, likely the peroneus brevis.  There is also some areas of high-grade partial tearing just distal to the malleolus.  Tendon is intact at the base of the fifth metatarsal.  Findings consistent with high-grade partial tearing of the peroneus brevis.       Assessment & Plan:   Right ankle pain and swelling secondary to partial peroneus brevis tendon tear  Status post remote trimalleolar right ankle fracture  Patient appears to have partial tearing of the peroneus brevis tendon likely as a result of her previous fracture or hardware along the lateral malleolus.  Functionally she is doing quite well.  Good strength and a normal foot strike pattern.  She will try to resume her body helix compression sleeve.  If she finds this to  be uncomfortable then we will put her in a med spec brace.  We will also start a topical nitroglycerin protocol with instructions to use a quarter patch daily and she will follow-up with me again in 2 to 3 weeks.  She will also start simple range of motion exercises.  Call with questions or concerns prior to follow-up.

## 2019-12-18 NOTE — Patient Instructions (Signed)

## 2020-01-03 ENCOUNTER — Ambulatory Visit
Admission: RE | Admit: 2020-01-03 | Discharge: 2020-01-03 | Disposition: A | Discharge status: Home / Self Care | Payer: PPO | Source: Ambulatory Visit | Attending: Sports Medicine | Admitting: Sports Medicine

## 2020-01-03 ENCOUNTER — Other Ambulatory Visit: Payer: Self-pay

## 2020-01-03 ENCOUNTER — Ambulatory Visit: Payer: Self-pay

## 2020-01-03 ENCOUNTER — Ambulatory Visit (INDEPENDENT_AMBULATORY_CARE_PROVIDER_SITE_OTHER): Payer: PPO | Admitting: Sports Medicine

## 2020-01-03 VITALS — BP 124/70 | Ht 66.0 in | Wt 190.0 lb

## 2020-01-03 DIAGNOSIS — M25571 Pain in right ankle and joints of right foot: Secondary | ICD-10-CM

## 2020-01-03 NOTE — Progress Notes (Addendum)
   Subjective:    Patient ID: Kaitlyn Norman, female    DOB: May 17, 1951, 69 y.o.   MRN: [id number]  HPI   Kaitlyn Norman comes in today for follow-up on a high-grade partial tear of her right peroneal brevis tendon.  She still has significant swelling and some discomfort.  She has noticed some weakness in the foot with walking.  She is here today for repeat ultrasound.   Review of Systems     Objective:   Physical Exam  Well-developed, well-nourished.  No acute distress.  Right ankle: There continues to be significant swelling along the lateral malleolus along the course of the peroneal tendons.  She does have 4/5 strength with resisted eversion of the right ankle/foot.  No significant tenderness to palpation.  Good pulses.  Walking with an antalgic gait and externally rotated foot.  Limited right ankle ultrasound: Peroneal brevis and longus tendons are seen proximal to the lateral malleolus and distal to the lateral malleolus but the peroneal brevis tendon appears to be absent at the level of the malleolus.  There continues to be significant soft tissue swelling as well.  Findings consistent with probable peroneal brevis tendon rupture.      Assessment & Plan:   Right ankle pain and swelling likely secondary to peroneal tendon rupture Status post remote right ankle ORIF, surgery performed by Dr. Doran Durand  Patient will be referred  to Dr. Doran Durand for further evaluation and treatment.  We will go ahead and get an x-ray of the right ankle but I will defer further imaging and work-up to the discretion of Dr. Doran Durand.  Follow-up with me as needed.  Addendum: X-ray shows hardware to be in place.  Nothing acute.

## 2020-01-10 DIAGNOSIS — M25571 Pain in right ankle and joints of right foot: Secondary | ICD-10-CM | POA: Diagnosis not present

## 2020-01-10 DIAGNOSIS — M79671 Pain in right foot: Secondary | ICD-10-CM | POA: Diagnosis not present

## 2020-01-11 DIAGNOSIS — S91332A Puncture wound without foreign body, left foot, initial encounter: Secondary | ICD-10-CM | POA: Diagnosis not present

## 2020-01-17 DIAGNOSIS — M25571 Pain in right ankle and joints of right foot: Secondary | ICD-10-CM | POA: Diagnosis not present

## 2020-06-11 DIAGNOSIS — Z23 Encounter for immunization: Secondary | ICD-10-CM | POA: Diagnosis not present

## 2020-08-14 DIAGNOSIS — D2262 Melanocytic nevi of left upper limb, including shoulder: Secondary | ICD-10-CM | POA: Diagnosis not present

## 2020-08-14 DIAGNOSIS — L918 Other hypertrophic disorders of the skin: Secondary | ICD-10-CM | POA: Diagnosis not present

## 2020-08-14 DIAGNOSIS — D1801 Hemangioma of skin and subcutaneous tissue: Secondary | ICD-10-CM | POA: Diagnosis not present

## 2020-08-14 DIAGNOSIS — L814 Other melanin hyperpigmentation: Secondary | ICD-10-CM | POA: Diagnosis not present

## 2020-08-14 DIAGNOSIS — L820 Inflamed seborrheic keratosis: Secondary | ICD-10-CM | POA: Diagnosis not present

## 2020-08-14 DIAGNOSIS — D225 Melanocytic nevi of trunk: Secondary | ICD-10-CM | POA: Diagnosis not present

## 2020-08-14 DIAGNOSIS — L570 Actinic keratosis: Secondary | ICD-10-CM | POA: Diagnosis not present

## 2020-08-14 DIAGNOSIS — D220 Melanocytic nevi of lip: Secondary | ICD-10-CM | POA: Diagnosis not present

## 2020-08-27 DIAGNOSIS — Z20822 Contact with and (suspected) exposure to covid-19: Secondary | ICD-10-CM | POA: Diagnosis not present

## 2020-09-24 DIAGNOSIS — E538 Deficiency of other specified B group vitamins: Secondary | ICD-10-CM | POA: Diagnosis not present

## 2020-09-24 DIAGNOSIS — E785 Hyperlipidemia, unspecified: Secondary | ICD-10-CM | POA: Diagnosis not present

## 2020-09-24 DIAGNOSIS — E559 Vitamin D deficiency, unspecified: Secondary | ICD-10-CM | POA: Diagnosis not present

## 2020-09-29 DIAGNOSIS — E559 Vitamin D deficiency, unspecified: Secondary | ICD-10-CM | POA: Diagnosis not present

## 2020-09-29 DIAGNOSIS — M8589 Other specified disorders of bone density and structure, multiple sites: Secondary | ICD-10-CM | POA: Diagnosis not present

## 2020-09-30 DIAGNOSIS — Z1212 Encounter for screening for malignant neoplasm of rectum: Secondary | ICD-10-CM | POA: Diagnosis not present

## 2020-09-30 DIAGNOSIS — R82998 Other abnormal findings in urine: Secondary | ICD-10-CM | POA: Diagnosis not present

## 2020-10-01 DIAGNOSIS — E559 Vitamin D deficiency, unspecified: Secondary | ICD-10-CM | POA: Diagnosis not present

## 2020-10-01 DIAGNOSIS — E785 Hyperlipidemia, unspecified: Secondary | ICD-10-CM | POA: Diagnosis not present

## 2020-10-01 DIAGNOSIS — M8580 Other specified disorders of bone density and structure, unspecified site: Secondary | ICD-10-CM | POA: Diagnosis not present

## 2020-10-01 DIAGNOSIS — G4733 Obstructive sleep apnea (adult) (pediatric): Secondary | ICD-10-CM | POA: Diagnosis not present

## 2020-10-01 DIAGNOSIS — E538 Deficiency of other specified B group vitamins: Secondary | ICD-10-CM | POA: Diagnosis not present

## 2020-10-01 DIAGNOSIS — D539 Nutritional anemia, unspecified: Secondary | ICD-10-CM | POA: Diagnosis not present

## 2020-10-01 DIAGNOSIS — E669 Obesity, unspecified: Secondary | ICD-10-CM | POA: Diagnosis not present

## 2020-10-01 DIAGNOSIS — Z0000 Encounter for general adult medical examination without abnormal findings: Secondary | ICD-10-CM | POA: Diagnosis not present

## 2020-11-12 DIAGNOSIS — H524 Presbyopia: Secondary | ICD-10-CM | POA: Diagnosis not present

## 2020-11-12 DIAGNOSIS — H52203 Unspecified astigmatism, bilateral: Secondary | ICD-10-CM | POA: Diagnosis not present

## 2020-11-12 DIAGNOSIS — H2513 Age-related nuclear cataract, bilateral: Secondary | ICD-10-CM | POA: Diagnosis not present

## 2021-01-07 DIAGNOSIS — Z124 Encounter for screening for malignant neoplasm of cervix: Secondary | ICD-10-CM | POA: Diagnosis not present

## 2021-01-07 DIAGNOSIS — Z6833 Body mass index (BMI) 33.0-33.9, adult: Secondary | ICD-10-CM | POA: Diagnosis not present

## 2021-02-19 ENCOUNTER — Other Ambulatory Visit: Payer: Self-pay | Admitting: Obstetrics and Gynecology

## 2021-02-19 DIAGNOSIS — Z1231 Encounter for screening mammogram for malignant neoplasm of breast: Secondary | ICD-10-CM

## 2021-02-26 ENCOUNTER — Other Ambulatory Visit: Payer: Self-pay

## 2021-02-26 ENCOUNTER — Ambulatory Visit
Admission: RE | Admit: 2021-02-26 | Discharge: 2021-02-26 | Disposition: A | Discharge status: Home / Self Care | Payer: PPO | Source: Ambulatory Visit | Attending: Obstetrics and Gynecology | Admitting: Obstetrics and Gynecology

## 2021-02-26 DIAGNOSIS — Z1231 Encounter for screening mammogram for malignant neoplasm of breast: Secondary | ICD-10-CM | POA: Diagnosis not present

## 2021-05-04 DIAGNOSIS — L237 Allergic contact dermatitis due to plants, except food: Secondary | ICD-10-CM | POA: Diagnosis not present

## 2021-08-18 DIAGNOSIS — D220 Melanocytic nevi of lip: Secondary | ICD-10-CM | POA: Diagnosis not present

## 2021-08-18 DIAGNOSIS — D1801 Hemangioma of skin and subcutaneous tissue: Secondary | ICD-10-CM | POA: Diagnosis not present

## 2021-08-18 DIAGNOSIS — L821 Other seborrheic keratosis: Secondary | ICD-10-CM | POA: Diagnosis not present

## 2021-08-18 DIAGNOSIS — L853 Xerosis cutis: Secondary | ICD-10-CM | POA: Diagnosis not present

## 2021-08-18 DIAGNOSIS — L308 Other specified dermatitis: Secondary | ICD-10-CM | POA: Diagnosis not present

## 2021-08-18 DIAGNOSIS — L814 Other melanin hyperpigmentation: Secondary | ICD-10-CM | POA: Diagnosis not present

## 2021-08-18 DIAGNOSIS — D225 Melanocytic nevi of trunk: Secondary | ICD-10-CM | POA: Diagnosis not present

## 2021-10-01 DIAGNOSIS — R82998 Other abnormal findings in urine: Secondary | ICD-10-CM | POA: Diagnosis not present

## 2021-10-01 DIAGNOSIS — E785 Hyperlipidemia, unspecified: Secondary | ICD-10-CM | POA: Diagnosis not present

## 2021-10-01 DIAGNOSIS — E559 Vitamin D deficiency, unspecified: Secondary | ICD-10-CM | POA: Diagnosis not present

## 2021-10-14 ENCOUNTER — Encounter: Payer: Self-pay | Admitting: Gastroenterology

## 2021-10-15 ENCOUNTER — Telehealth: Payer: Self-pay | Admitting: Gastroenterology

## 2021-10-15 NOTE — Telephone Encounter (Signed)
Hey Dr. Fuller Plan,  ? ?Patient called in to schedule recall colonoscopy. She was a former patient of Dr. Sharlett Iles and would like you to be her provider. Is it okay to schedule directly with you? ? ?Thank you ?

## 2021-10-15 NOTE — Telephone Encounter (Signed)
OK to transfer to me and schedule direct colonoscopy. ?

## 2021-10-29 DIAGNOSIS — E785 Hyperlipidemia, unspecified: Secondary | ICD-10-CM | POA: Diagnosis not present

## 2021-11-16 DIAGNOSIS — H2513 Age-related nuclear cataract, bilateral: Secondary | ICD-10-CM | POA: Diagnosis not present

## 2021-11-16 DIAGNOSIS — H52203 Unspecified astigmatism, bilateral: Secondary | ICD-10-CM | POA: Diagnosis not present

## 2021-11-25 ENCOUNTER — Ambulatory Visit (AMBULATORY_SURGERY_CENTER): Payer: PPO | Admitting: *Deleted

## 2021-11-25 VITALS — Ht 66.0 in | Wt 204.0 lb

## 2021-11-25 DIAGNOSIS — Z1211 Encounter for screening for malignant neoplasm of colon: Secondary | ICD-10-CM

## 2021-11-25 MED ORDER — PLENVU 140 G PO SOLR: 1.0000 | Freq: Once | ORAL | 0 refills | Status: AC

## 2021-11-25 NOTE — Progress Notes (Signed)
Patient's pre-visit was done today over the phone with the patient. Name,DOB and address verified. Patient denies any allergies to Eggs and Soy. Patient denies any problems with anesthesia/sedation. Patient is not taking any diet pills or blood thinners. No home Oxygen. Insurance confirmed with patient. ? ?Prep instructions sent to pt's MyChart (if available) & mailed to pt-pt is aware. Patient understands to call us back with any questions or concerns. Patient is aware of our care-partner policy. [place] medicare coupon sent with RX and mailed to pt-pt aware! ? ?EMMI education assigned to the patient for the procedure, sent to [facility].  ? ?The patient is COVID-19 vaccinated.   ?

## 2021-12-09 DIAGNOSIS — M8580 Other specified disorders of bone density and structure, unspecified site: Secondary | ICD-10-CM | POA: Diagnosis not present

## 2021-12-09 DIAGNOSIS — Z1339 Encounter for screening examination for other mental health and behavioral disorders: Secondary | ICD-10-CM | POA: Diagnosis not present

## 2021-12-09 DIAGNOSIS — E785 Hyperlipidemia, unspecified: Secondary | ICD-10-CM | POA: Diagnosis not present

## 2021-12-09 DIAGNOSIS — R030 Elevated blood-pressure reading, without diagnosis of hypertension: Secondary | ICD-10-CM | POA: Diagnosis not present

## 2021-12-09 DIAGNOSIS — K219 Gastro-esophageal reflux disease without esophagitis: Secondary | ICD-10-CM | POA: Diagnosis not present

## 2021-12-09 DIAGNOSIS — G4733 Obstructive sleep apnea (adult) (pediatric): Secondary | ICD-10-CM | POA: Diagnosis not present

## 2021-12-09 DIAGNOSIS — Z0000 Encounter for general adult medical examination without abnormal findings: Secondary | ICD-10-CM | POA: Diagnosis not present

## 2021-12-09 DIAGNOSIS — E669 Obesity, unspecified: Secondary | ICD-10-CM | POA: Diagnosis not present

## 2021-12-09 DIAGNOSIS — Z1331 Encounter for screening for depression: Secondary | ICD-10-CM | POA: Diagnosis not present

## 2021-12-09 DIAGNOSIS — Z23 Encounter for immunization: Secondary | ICD-10-CM | POA: Diagnosis not present

## 2021-12-09 DIAGNOSIS — E538 Deficiency of other specified B group vitamins: Secondary | ICD-10-CM | POA: Diagnosis not present

## 2021-12-09 DIAGNOSIS — Z1212 Encounter for screening for malignant neoplasm of rectum: Secondary | ICD-10-CM | POA: Diagnosis not present

## 2021-12-15 ENCOUNTER — Ambulatory Visit (AMBULATORY_SURGERY_CENTER): Payer: PPO | Admitting: Gastroenterology

## 2021-12-15 ENCOUNTER — Encounter: Payer: Self-pay | Admitting: Gastroenterology

## 2021-12-15 VITALS — BP 121/64 | HR 66 | Temp 96.8°F | Resp 13 | Ht 66.0 in | Wt 204.0 lb

## 2021-12-15 DIAGNOSIS — Z1211 Encounter for screening for malignant neoplasm of colon: Secondary | ICD-10-CM | POA: Diagnosis not present

## 2021-12-15 DIAGNOSIS — G4730 Sleep apnea, unspecified: Secondary | ICD-10-CM | POA: Diagnosis not present

## 2021-12-15 DIAGNOSIS — E785 Hyperlipidemia, unspecified: Secondary | ICD-10-CM | POA: Diagnosis not present

## 2021-12-15 DIAGNOSIS — J45909 Unspecified asthma, uncomplicated: Secondary | ICD-10-CM | POA: Diagnosis not present

## 2021-12-15 MED ORDER — SODIUM CHLORIDE 0.9 % IV SOLN: 500.0000 mL | Freq: Once | INTRAVENOUS | Status: DC

## 2021-12-15 NOTE — Progress Notes (Signed)
No problems noted in the recovery room. maw 

## 2021-12-15 NOTE — Progress Notes (Signed)
? ?History & Physical ? ?Primary Care Physician:  Crist Infante, MD ?Primary Gastroenterologist: Lucio Edward, MD ? ?CHIEF COMPLAINT:  CRC screening  ? ?HPI: Kaitlyn Norman is a 71 y.o. female for CRC screening, average risk, with colonoscopy. ? ? ?Past Medical History:  ?Diagnosis Date  ? Asthma   ? Hyperlipidemia   ? Sleep apnea   ? uses Mouth Piece  ? ? ?Past Surgical History:  ?Procedure Laterality Date  ? ANKLE FRACTURE SURGERY    ? COLONOSCOPY  11/01/2011  ? Dr.Patterson  ? EXCISION OF ENLARGED LYMPH NODE    ? GYNECOLOGIC CRYOSURGERY    ? ? ?Prior to Admission medications   ?Medication Sig Start Date End Date Taking? Authorizing Provider  ?Ascorbic Acid (VITAMIN C PO) Take by mouth.    [provider]  ?Cyanocobalamin (VITAMIN B-12 PO) Take by mouth.    [provider]  ?rosuvastatin (CRESTOR) 10 MG tablet Take 10 mg by mouth at bedtime. 12/09/21   [provider]  ?VITAMIN D PO Take by mouth.    [provider]  ? ? ?Current Outpatient Medications  ?Medication Sig Dispense Refill  ? Ascorbic Acid (VITAMIN C PO) Take by mouth.    ? Cyanocobalamin (VITAMIN B-12 PO) Take by mouth.    ? rosuvastatin (CRESTOR) 10 MG tablet Take 10 mg by mouth at bedtime.    ? VITAMIN D PO Take by mouth.    ? ?Current Facility-Administered Medications  ?Medication Dose Route Frequency Provider Last Rate Last Admin  ? 0.9 %  sodium chloride infusion  500 mL Intravenous Once Ladene Artist, MD      ? ? ?Allergies as of 12/15/2021  ? (No Known Allergies)  ? ? ?Family History  ?Problem Relation Age of Onset  ? Lung cancer Mother   ? Hypertension Mother   ? Cancer Mother   ?     LUNG CANCER  ? Osteopenia Mother   ? Prostate cancer Father   ? Cancer Father   ?     PROSTATE CANCER  ? Cancer Brother   ?     THROAT CANCER  ? Diabetes Paternal Uncle   ? Colon cancer Neg Hx   ? Colon polyps Neg Hx   ? ? ?Social History  ? ?Socioeconomic History  ? Marital status: Married  ?  Spouse name: Not on file  ?  Number of children: 2  ? Years of education: Kaitlyn Norman  ? Highest education level: Not on file  ?Occupational History  ? Not on file  ?Tobacco Use  ? Smoking status: Former  ? Smokeless tobacco: Never  ? Tobacco comments:  ?  Quit 1972  ?Vaping Use  ? Vaping Use: Never used  ?Substance and Sexual Activity  ? Alcohol use: Yes  ?  Alcohol/week: 8.0 standard drinks  ?  Types: 8 Glasses of wine per week  ? Drug use: No  ? Sexual activity: Yes  ?  Birth control/protection: Post-menopausal  ?Other Topics Concern  ? Not on file  ?Social History Narrative  ? Not on file  ? ?Social Determinants of Health  ? ?Financial Resource Strain: Not on file  ?Food Insecurity: Not on file  ?Transportation Needs: Not on file  ?Physical Activity: Not on file  ?Stress: Not on file  ?Social Connections: Not on file  ?Intimate Partner Violence: Not on file  ? ? ?Review of Systems: ? ?All systems reviewed an negative except where noted in HPI. ? ?Gen: Denies any  fever, chills, sweats, anorexia, fatigue, weakness, malaise, weight loss, and sleep disorder ?CV: Denies chest pain, angina, palpitations, syncope, orthopnea, PND, peripheral edema, and claudication. ?Resp: Denies dyspnea at rest, dyspnea with exercise, cough, sputum, wheezing, coughing up blood, and pleurisy. ?GI: Denies vomiting blood, jaundice, and fecal incontinence.   Denies dysphagia or odynophagia. ?GU : Denies urinary burning, blood in urine, urinary frequency, urinary hesitancy, nocturnal urination, and urinary incontinence. ?MS: Denies joint pain, limitation of movement, and swelling, stiffness, low back pain, extremity pain. Denies muscle weakness, cramps, atrophy.  ?Derm: Denies rash, itching, dry skin, hives, moles, warts, or unhealing ulcers.  ?Psych: Denies depression, anxiety, memory loss, suicidal ideation, hallucinations, paranoia, and confusion. ?Heme: Denies bruising, bleeding, and enlarged lymph nodes. ?Neuro:  Denies any headaches, dizziness, paresthesias. ?Endo:   Denies any problems with DM, thyroid, adrenal function. ? ? ?Physical Exam: ?General:  Alert, well-developed, in NAD ?Head:  Normocephalic and atraumatic. ?Eyes:  Sclera clear, no icterus.   Conjunctiva pink. ?Ears:  Normal auditory acuity. ?Mouth:  No deformity or lesions.  ?Neck:  Supple; no masses . ?Lungs:  Clear throughout to auscultation.   No wheezes, crackles, or rhonchi. No acute distress. ?Heart:  Regular rate and rhythm; no murmurs. ?Abdomen:  Soft, nondistended, nontender. No masses, hepatomegaly. No obvious masses.  Normal bowel .    ?Rectal:  Deferred   ?Msk:  Symmetrical without gross deformities.Marland Kitchen ?Pulses:  Normal pulses noted. ?Extremities:  Without edema. ?Neurologic:  Alert and  oriented x4;  grossly normal neurologically. ?Skin:  Intact without significant lesions or rashes. ?Cervical Nodes:  No significant cervical adenopathy. ?Psych:  Alert and cooperative. Normal mood and affect. ? ? ?Impression / Plan:  ? ?CRC screening, average risk, for colonoscopy. ? ?Kaitlyn Norman Plan  12/15/2021, 9:35 AM ?See Shea Evans, [facility], to contact our on call provider ? ? ?  ?

## 2021-12-15 NOTE — Patient Instructions (Addendum)
Handout was given to your care partner on hemorrhoids. ?You may resume your current medications today. ?No repeat colonoscopy due to age and the absence of colonic polyps. ?Please call if any questions or concerns. ?  ? ? ? ?YOU HAD AN ENDOSCOPIC PROCEDURE TODAY AT [facility]:   Refer to the procedure report that was given to you for any specific questions about what was found during the examination.  If the procedure report does not answer your questions, please call your gastroenterologist to clarify.  If you requested that your care partner not be given the details of your procedure findings, then the procedure report has been included in a sealed envelope for you to review at your convenience later. ? ?YOU SHOULD EXPECT: Some feelings of bloating in the abdomen. Passage of more gas than usual.  Walking can help get rid of the air that was put into your GI tract during the procedure and reduce the bloating. If you had a lower endoscopy (such as a colonoscopy or flexible sigmoidoscopy) you may notice spotting of blood in your stool or on the toilet paper. If you underwent a bowel prep for your procedure, you may not have a normal bowel movement for a few days. ? ?Please Note:  You might notice some irritation and congestion in your nose or some drainage.  This is from the oxygen used during your procedure.  There is no need for concern and it should clear up in a day or so. ? ?SYMPTOMS TO REPORT IMMEDIATELY: ? ?Following lower endoscopy (colonoscopy or flexible sigmoidoscopy): ? Excessive amounts of blood in the stool ? Significant tenderness or worsening of abdominal pains ? Swelling of the abdomen that is new, acute ? Fever of 100?F or higher ? ? ?For urgent or emergent issues, a gastroenterologist can be reached at any hour by calling [phone]. ?Do not use MyChart messaging for urgent concerns.  ? ? ?DIET:  We do recommend a small meal at first, but then you may proceed to your regular  diet.  Drink plenty of fluids but you should avoid alcoholic beverages for 24 hours. ? ?ACTIVITY:  You should plan to take it easy for the rest of today and you should NOT DRIVE or use heavy machinery until tomorrow (because of the sedation medicines used during the test).   ? ?FOLLOW UP: ?Our staff will call the number listed on your records 48-72 hours following your procedure to check on you and address any questions or concerns that you may have regarding the information given to you following your procedure. If we do not reach you, we will leave a message.  We will attempt to reach you two times.  During this call, we will ask if you have developed any symptoms of COVID 19. If you develop any symptoms (ie: fever, flu-like symptoms, shortness of breath, cough etc.) before then, please call [phone].  If you test positive for Covid 19 in the 2 weeks post procedure, please call and report this information to [place].   ? ?If any biopsies were taken you will be contacted by phone or by letter within the next 1-3 weeks.  Please call us at [phone] if you have not heard about the biopsies in 3 weeks.  ? ? ?SIGNATURES/CONFIDENTIALITY: ?You and/or your care partner have signed paperwork which will be entered into your electronic medical record.  These signatures attest to the fact that that the information above on your After Visit Summary has  been reviewed and is understood.  Full responsibility of the confidentiality of this discharge information lies with you and/or your care-partner.  ?

## 2021-12-15 NOTE — Op Note (Signed)
[facility] ?Patient Name: Kaitlyn Norman ?Procedure Date: 12/15/2021 9:37 AM ?MRN: [id number] ?Endoscopist: Ladene Artist , MD ?Age: 71 ?Referring MD:  ?Date of Birth: [birth year] ?Gender: Female ?Account #: [id number] ?Procedure:                Colonoscopy ?Indications:              Screening for colorectal malignant neoplasm ?Medicines:                Monitored Anesthesia Care ?Procedure:                Pre-Anesthesia Assessment: ?                          - Prior to the procedure, a History and Physical  ?                          was performed, and patient medications and  ?                          allergies were reviewed. The patient's tolerance of  ?                          previous anesthesia was also reviewed. The risks  ?                          and benefits of the procedure and the sedation  ?                          options and risks were discussed with the patient.  ?                          All questions were answered, and informed consent  ?                          was obtained. Prior Anticoagulants: The patient has  ?                          taken no previous anticoagulant or antiplatelet  ?                          agents. ASA Grade Assessment: II - A patient with  ?                          mild systemic disease. After reviewing the risks  ?                          and benefits, the patient was deemed in  ?                          satisfactory condition to undergo the procedure. ?                          After obtaining informed consent, the colonoscope  ?  was passed under direct vision. Throughout the  ?                          procedure, the patient's blood pressure, pulse, and  ?                          oxygen saturations were monitored continuously. The  ?                          CF [id number] #[id number] was introduced through the anus  ?                          and advanced to the the cecum, identified by  ?                          appendiceal orifice  and ileocecal valve. The  ?                          ileocecal valve, appendiceal orifice, and rectum  ?                          were photographed. The quality of the bowel  ?                          preparation was good. The colonoscopy was performed  ?                          without difficulty. The patient tolerated the  ?                          procedure well. ?Scope In: 9:48:06 AM ?Scope Out: 10:04:44 AM ?Scope Withdrawal Time: 0 hours 12 minutes 39 seconds  ?Total Procedure Duration: 0 hours 16 minutes 38 seconds  ?Findings:                 The perianal and digital rectal examinations were  ?                          normal. ?                          Internal hemorrhoids were found during  ?                          retroflexion. The hemorrhoids were small and Grade  ?                          I (internal hemorrhoids that do not prolapse). ?                          The exam was otherwise without abnormality on  ?                          direct and retroflexion views. ?Complications:            No immediate complications. Estimated  blood loss:  ?                          None. ?Estimated Blood Loss:     Estimated blood loss: none. ?Impression:               - Internal hemorrhoids. ?                          - The examination was otherwise normal on direct  ?                          and retroflexion views. ?                          - No specimens collected. ?Recommendation:           - Patient has a contact number available for  ?                          emergencies. The signs and symptoms of potential  ?                          delayed complications were discussed with the  ?                          patient. Return to normal activities tomorrow.  ?                          Written discharge instructions were provided to the  ?                          patient. ?                          - Resume previous diet. ?                          - Continue present medications. ?                          -  No repeat colonoscopy due to age and the absence  ?                          of colonic polyps. ?Ladene Artist, MD ?12/15/2021 10:08:19 AM ?This report has been signed electronically. ?

## 2021-12-15 NOTE — Progress Notes (Signed)
Pt non-responsive, VVS, Report to RN  °

## 2021-12-15 NOTE — Progress Notes (Signed)
Vitals-DT 

## 2021-12-17 ENCOUNTER — Telehealth: Payer: Self-pay

## 2021-12-17 NOTE — Telephone Encounter (Signed)
?  Follow up Call- ? ? ?  12/15/2021  ?  9:07 AM  ?Call back number  ?Post procedure Call Back phone  # [phone]  ?Permission to leave phone message Yes  ?  ? ?Patient questions: ? ?Do you have a fever, pain , or abdominal swelling? No. ?Pain Score  0 * ? ?Have you tolerated food without any problems? Yes.   ? ?Have you been able to return to your normal activities? Yes.   ? ?Do you have any questions about your discharge instructions: ?Diet   No. ?Medications  No. ?Follow up visit  No. ? ?Do you have questions or concerns about your Care? No. ? ?Actions: ?* If pain score is 4 or above: ?No action needed, pain <4. ? ? ?

## 2022-03-01 ENCOUNTER — Other Ambulatory Visit: Payer: Self-pay | Admitting: Obstetrics and Gynecology

## 2022-03-01 DIAGNOSIS — Z1231 Encounter for screening mammogram for malignant neoplasm of breast: Secondary | ICD-10-CM

## 2022-03-09 ENCOUNTER — Ambulatory Visit
Admission: RE | Admit: 2022-03-09 | Discharge: 2022-03-09 | Disposition: A | Discharge status: Home / Self Care | Payer: PPO | Source: Ambulatory Visit | Attending: Obstetrics and Gynecology | Admitting: Obstetrics and Gynecology

## 2022-03-09 DIAGNOSIS — Z1231 Encounter for screening mammogram for malignant neoplasm of breast: Secondary | ICD-10-CM | POA: Diagnosis not present

## 2022-03-22 DIAGNOSIS — Z6833 Body mass index (BMI) 33.0-33.9, adult: Secondary | ICD-10-CM | POA: Diagnosis not present

## 2022-03-22 DIAGNOSIS — Z01419 Encounter for gynecological examination (general) (routine) without abnormal findings: Secondary | ICD-10-CM | POA: Diagnosis not present

## 2022-04-02 IMAGING — MG DIGITAL SCREENING BILAT W/ TOMO W/ CAD
8 series · 8 of 24 positions shown · non-contrast
Comparison: Previous exam(s).

CLINICAL DATA: Screening.

EXAM:
DIGITAL SCREENING BILATERAL MAMMOGRAM WITH TOMO AND CAD

[R MLO synth-2D]
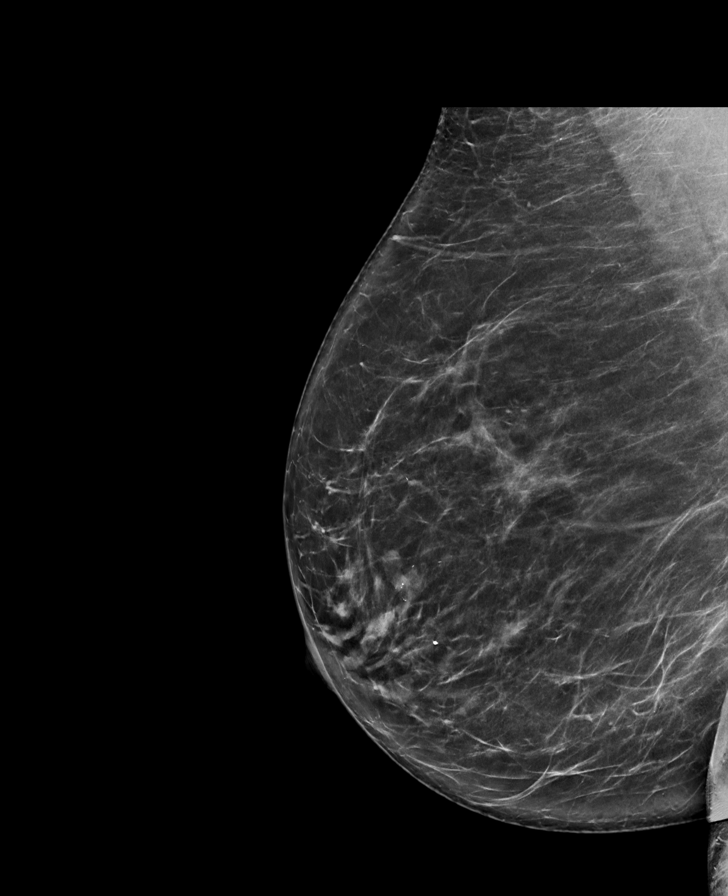

[L MLO synth-2D]
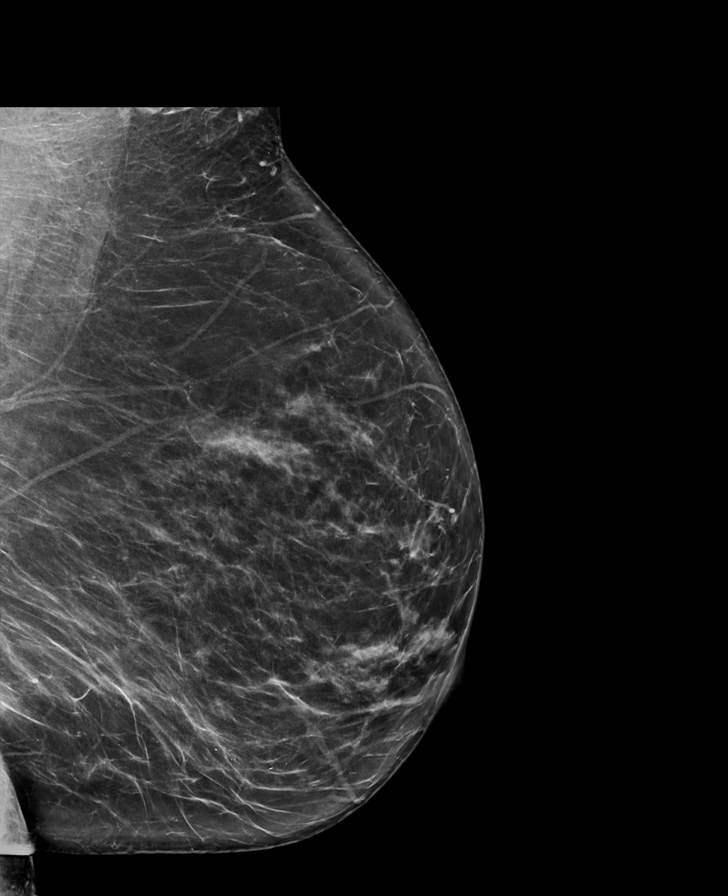

[L CC synth-2D]
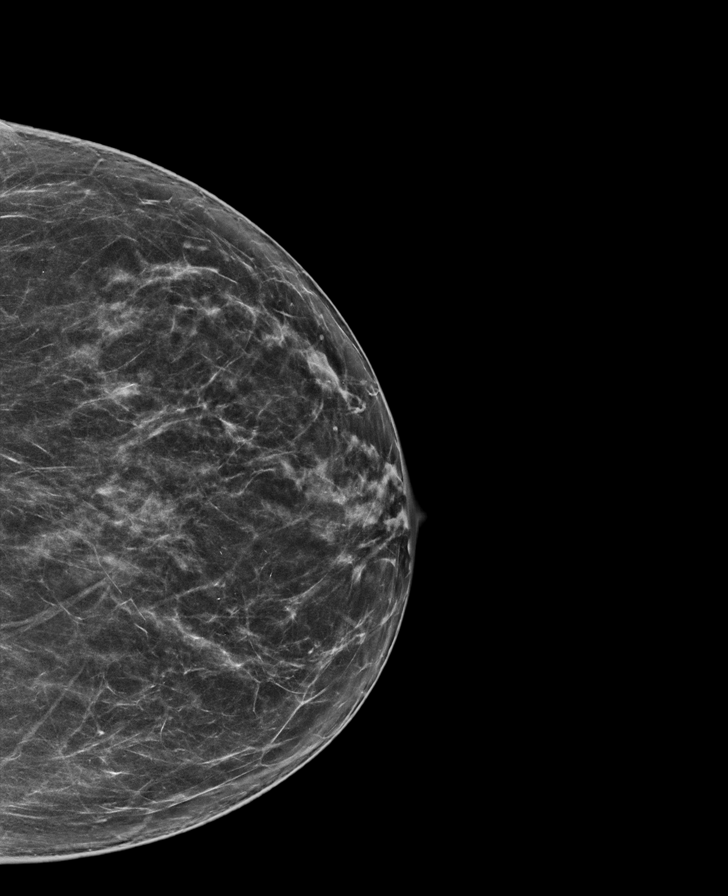

[R CC synth-2D]
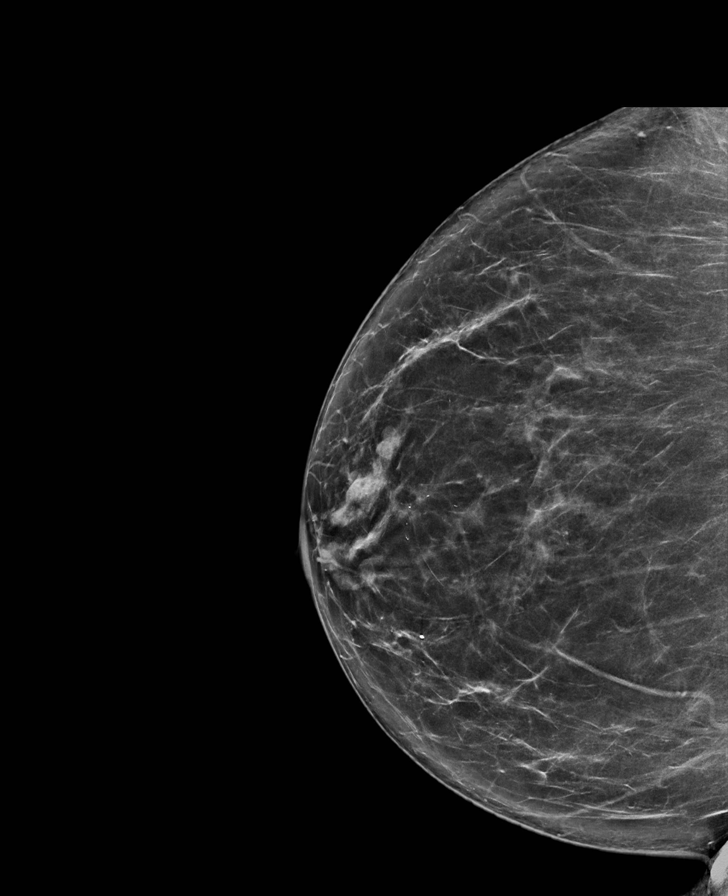

[R CC tomo · tomo slice 42/83.0]
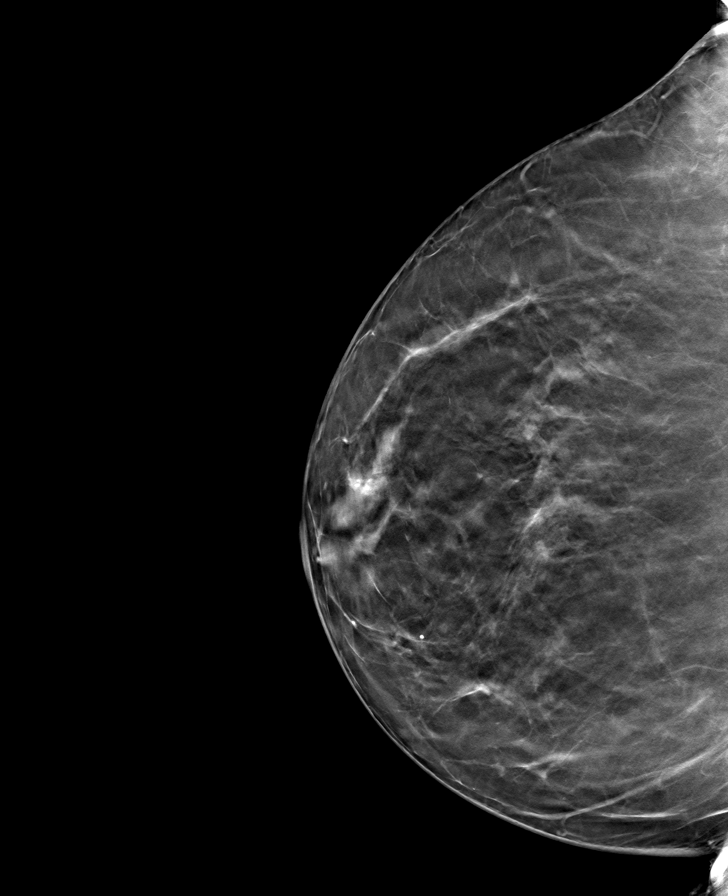

[L CC tomo · tomo slice 38/75.0]
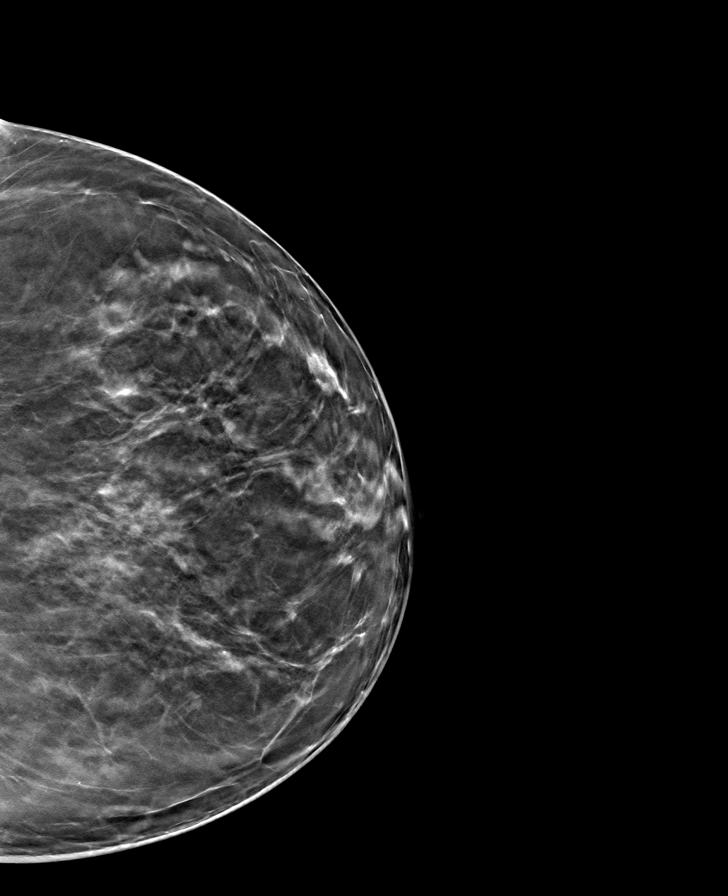

[R MLO tomo · tomo slice 44/87.0]
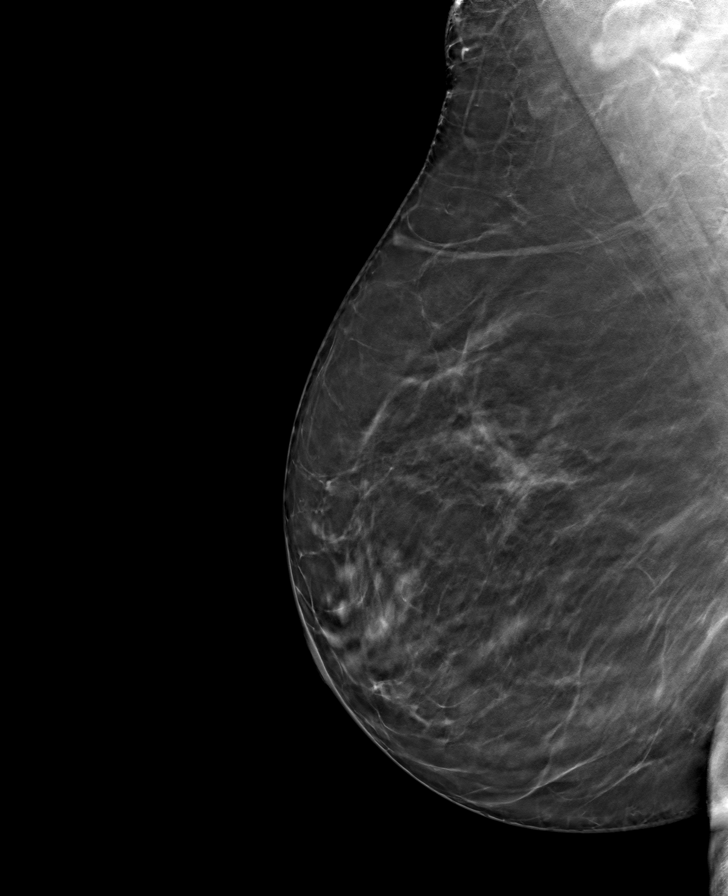

[L MLO tomo · tomo slice 44/87.0]
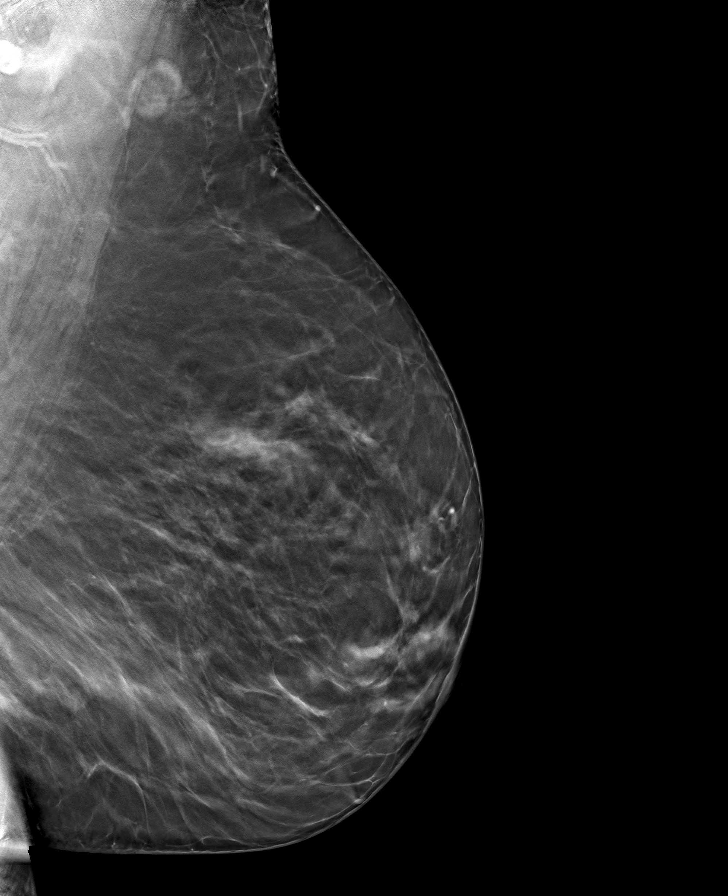

[8 of 24 positions shown; findings below may reference images not displayed]

ACR Breast Density Category b: There are scattered areas of
fibroglandular density.
FINDINGS: There are no findings suspicious for malignancy. Images were
processed with CAD.
IMPRESSION: No mammographic evidence of malignancy. A result letter of this
screening mammogram will be mailed directly to the patient.

RECOMMENDATION:
Screening mammogram in one year. (Code:[id number])

BI-RADS CATEGORY  1: Negative.

## 2022-04-22 DIAGNOSIS — Z20822 Contact with and (suspected) exposure to covid-19: Secondary | ICD-10-CM | POA: Diagnosis not present

## 2022-06-12 DIAGNOSIS — Z23 Encounter for immunization: Secondary | ICD-10-CM | POA: Diagnosis not present

## 2022-08-24 DIAGNOSIS — L570 Actinic keratosis: Secondary | ICD-10-CM | POA: Diagnosis not present

## 2022-08-24 DIAGNOSIS — D225 Melanocytic nevi of trunk: Secondary | ICD-10-CM | POA: Diagnosis not present

## 2022-08-24 DIAGNOSIS — D2262 Melanocytic nevi of left upper limb, including shoulder: Secondary | ICD-10-CM | POA: Diagnosis not present

## 2022-08-24 DIAGNOSIS — L821 Other seborrheic keratosis: Secondary | ICD-10-CM | POA: Diagnosis not present

## 2022-08-24 DIAGNOSIS — L814 Other melanin hyperpigmentation: Secondary | ICD-10-CM | POA: Diagnosis not present

## 2022-08-24 DIAGNOSIS — D2272 Melanocytic nevi of left lower limb, including hip: Secondary | ICD-10-CM | POA: Diagnosis not present

## 2022-08-24 DIAGNOSIS — L820 Inflamed seborrheic keratosis: Secondary | ICD-10-CM | POA: Diagnosis not present

## 2022-08-24 DIAGNOSIS — Z85828 Personal history of other malignant neoplasm of skin: Secondary | ICD-10-CM | POA: Diagnosis not present

## 2022-08-24 DIAGNOSIS — D220 Melanocytic nevi of lip: Secondary | ICD-10-CM | POA: Diagnosis not present

## 2022-08-24 DIAGNOSIS — D2271 Melanocytic nevi of right lower limb, including hip: Secondary | ICD-10-CM | POA: Diagnosis not present

## 2022-08-24 DIAGNOSIS — D2261 Melanocytic nevi of right upper limb, including shoulder: Secondary | ICD-10-CM | POA: Diagnosis not present

## 2022-11-29 DIAGNOSIS — H2513 Age-related nuclear cataract, bilateral: Secondary | ICD-10-CM | POA: Diagnosis not present

## 2022-11-29 DIAGNOSIS — H52203 Unspecified astigmatism, bilateral: Secondary | ICD-10-CM | POA: Diagnosis not present

## 2023-01-11 DIAGNOSIS — E785 Hyperlipidemia, unspecified: Secondary | ICD-10-CM | POA: Diagnosis not present

## 2023-01-11 DIAGNOSIS — M8589 Other specified disorders of bone density and structure, multiple sites: Secondary | ICD-10-CM | POA: Diagnosis not present

## 2023-01-11 DIAGNOSIS — D72818 Other decreased white blood cell count: Secondary | ICD-10-CM | POA: Diagnosis not present

## 2023-01-11 DIAGNOSIS — E538 Deficiency of other specified B group vitamins: Secondary | ICD-10-CM | POA: Diagnosis not present

## 2023-01-11 DIAGNOSIS — R7989 Other specified abnormal findings of blood chemistry: Secondary | ICD-10-CM | POA: Diagnosis not present

## 2023-01-11 DIAGNOSIS — K219 Gastro-esophageal reflux disease without esophagitis: Secondary | ICD-10-CM | POA: Diagnosis not present

## 2023-01-11 DIAGNOSIS — Z1212 Encounter for screening for malignant neoplasm of rectum: Secondary | ICD-10-CM | POA: Diagnosis not present

## 2023-01-11 DIAGNOSIS — E559 Vitamin D deficiency, unspecified: Secondary | ICD-10-CM | POA: Diagnosis not present

## 2023-01-18 DIAGNOSIS — E669 Obesity, unspecified: Secondary | ICD-10-CM | POA: Diagnosis not present

## 2023-01-18 DIAGNOSIS — G4733 Obstructive sleep apnea (adult) (pediatric): Secondary | ICD-10-CM | POA: Diagnosis not present

## 2023-01-18 DIAGNOSIS — D72818 Other decreased white blood cell count: Secondary | ICD-10-CM | POA: Diagnosis not present

## 2023-01-18 DIAGNOSIS — H8112 Benign paroxysmal vertigo, left ear: Secondary | ICD-10-CM | POA: Diagnosis not present

## 2023-01-18 DIAGNOSIS — Z0000 Encounter for general adult medical examination without abnormal findings: Secondary | ICD-10-CM | POA: Diagnosis not present

## 2023-01-18 DIAGNOSIS — Z1212 Encounter for screening for malignant neoplasm of rectum: Secondary | ICD-10-CM | POA: Diagnosis not present

## 2023-01-18 DIAGNOSIS — E538 Deficiency of other specified B group vitamins: Secondary | ICD-10-CM | POA: Diagnosis not present

## 2023-01-18 DIAGNOSIS — I10 Essential (primary) hypertension: Secondary | ICD-10-CM | POA: Diagnosis not present

## 2023-01-18 DIAGNOSIS — E785 Hyperlipidemia, unspecified: Secondary | ICD-10-CM | POA: Diagnosis not present

## 2023-01-18 DIAGNOSIS — M8580 Other specified disorders of bone density and structure, unspecified site: Secondary | ICD-10-CM | POA: Diagnosis not present

## 2023-01-18 DIAGNOSIS — R82998 Other abnormal findings in urine: Secondary | ICD-10-CM | POA: Diagnosis not present

## 2023-01-18 DIAGNOSIS — E559 Vitamin D deficiency, unspecified: Secondary | ICD-10-CM | POA: Diagnosis not present

## 2023-01-18 DIAGNOSIS — K219 Gastro-esophageal reflux disease without esophagitis: Secondary | ICD-10-CM | POA: Diagnosis not present

## 2023-01-18 DIAGNOSIS — R0683 Snoring: Secondary | ICD-10-CM | POA: Diagnosis not present

## 2023-03-10 ENCOUNTER — Other Ambulatory Visit: Payer: Self-pay | Admitting: Internal Medicine

## 2023-03-10 DIAGNOSIS — Z1231 Encounter for screening mammogram for malignant neoplasm of breast: Secondary | ICD-10-CM

## 2023-03-15 ENCOUNTER — Ambulatory Visit: Admission: RE | Admit: 2023-03-15 | Payer: PPO | Source: Ambulatory Visit

## 2023-03-15 DIAGNOSIS — Z1231 Encounter for screening mammogram for malignant neoplasm of breast: Secondary | ICD-10-CM

## 2023-04-11 DIAGNOSIS — Z6834 Body mass index (BMI) 34.0-34.9, adult: Secondary | ICD-10-CM | POA: Diagnosis not present

## 2023-04-11 DIAGNOSIS — Z124 Encounter for screening for malignant neoplasm of cervix: Secondary | ICD-10-CM | POA: Diagnosis not present

## 2023-06-14 DIAGNOSIS — H10413 Chronic giant papillary conjunctivitis, bilateral: Secondary | ICD-10-CM | POA: Diagnosis not present

## 2023-08-23 DIAGNOSIS — Z6834 Body mass index (BMI) 34.0-34.9, adult: Secondary | ICD-10-CM | POA: Diagnosis not present

## 2023-10-13 ENCOUNTER — Ambulatory Visit: Payer: PPO | Admitting: Sports Medicine

## 2023-10-13 ENCOUNTER — Other Ambulatory Visit: Payer: Self-pay

## 2023-10-13 VITALS — BP 129/65 | Ht 66.0 in | Wt 195.0 lb

## 2023-10-13 DIAGNOSIS — M25562 Pain in left knee: Secondary | ICD-10-CM | POA: Diagnosis not present

## 2023-10-13 MED ORDER — PREDNISONE 20 MG PO TABS: 20.0000 mg | ORAL_TABLET | Freq: Two times a day (BID) | ORAL | 0 refills | Status: AC

## 2023-10-13 MED ORDER — PREDNISONE 20 MG PO TABS: 20.0000 mg | ORAL_TABLET | Freq: Two times a day (BID) | ORAL | 0 refills | Status: DC

## 2023-10-13 NOTE — Progress Notes (Addendum)
 PCP: Rodrigo Ran, MD  SUBJECTIVE:   HPI:  Patient is a 73 y.o. female here with chief complaint of 2 weeks of left leg pain.  She initially injured this during pilates class doing leg circles with springs that she thinks were too resistant for her. She has tried resting, icing, ibuprofen and celebrex without improvement. She is significantly limited in her motion of the left knee 2/2 her pain. She is having trouble putting shoes on/off. She locates most of the pain to the posteromedial aspect of the left knee an upper calf, though also having some pain over lateral left hip. No back pain. No radiating numbness/tingling.  Pertinent ROS were reviewed with the patient and found to be negative unless otherwise specified above in HPI.   PERTINENT  PMH / PSH / FH / SH:  Past Medical, Surgical, Social, and Family History Reviewed & Updated in the EMR.  Pertinent findings include:  Non-contributory  No Known Allergies  OBJECTIVE:  BP 129/65   Ht 5\' 6"  (1.676 m)   Wt 195 lb (88.5 kg)   BMI 31.47 kg/m   PHYSICAL EXAM:  GEN: Alert and Oriented, NAD, comfortable in exam room RESP: Unlabored respirations, symmetric chest rise PSY: normal mood, congruent affect   MSK EXAM: LEFT HIP No deformity, ecchymosis, swelling. ROM limited in IR and ER by pain distally in posterior knee, flexion full.  Strength 5/5 strength with hip flexion, abduction, and adduction Tenderness to palpation at greater trochanter and distal glute med/min tendons, remainder non-tender, particularly no pain at ischial tuberosity and proximal hamstring. Neurovascularly intact distally. Negative logroll and stinchfield. Negative faber, fadir, and piriformis stretches.  LEFT KNEE No gross deformity or ecchymoses. Mild posterior knee swelling. TTP at pes anserine bursa, posteromedial joint line, distal medial hamstring tendons. ROM guarded, but can get to full extension and 110d of flexion.  Strength 5/5 with  extension, though 4/5 with flexion (+ pain medial hamstring). Negative ant/post drawers. Unable to tolerate valgus/varus testing.  NV intact distally.  Limited MSK Ultrasound of the Left Knee No effusion in the suprapatellar pouch. Distal quadriceps tendon normal. Lateral meniscus is intact with mild narrowing along the lateral joint line, though no spurring. Medial meniscus is intact with mild narrowing along the  joint line, though no spurring. Posteriorly, the distal semimembranosus and proximal medial gastrocnemius visualized in both LAX and SAX with significant hypoechoic fluid surrounding their intersection. Mild hypoechoic changes noted in distal semimembranosus. No significant tearing noted. No visualized Baker's cyst and posterior vessels visualized and patent. Pes anserine visualized without notable hypoechoic changes.  Impression: Left semimembranosus strain and surrounding swelling.  U/S performed and interpreted by Glean Salen, MD and Roanna Epley, MD.   Assessment & Plan Acute pain of left knee Presentation most c/w strain of the left semimembranosus and medial gastrocnemius with surrounding soft tissue swelling.  Plan: -Given her swelling and pain with limited improvement from icing and ibuprofen/celebrex, plan for a 7d burst of Prednisone 40mg  every day. -Compression sleeve provided today, encouraged consistent use while awake -5/16th heel lifts bilaterally provided to offset stress on gastroc heads -Continue icing prn -F/u in 2 weeks for re-examination and integration of [place]ling and calf exercises    Glean Salen, MD PGY-4, Sports Medicine Fellow [facility]  I evaluated the patient with the [facility] resident and agree with assessment and plan.  Note reviewed and modified by me. The [place] is most consistent with semi-membranosus-gastrocnemius bursitis at the point where they  intersect below the post knee joint. This usually responds well to  prednisone which she prefers to a CSI.  Sterling Big, MD

## 2023-10-13 NOTE — Patient Instructions (Addendum)
 You have strain of the distal hamstring (semimembranosis) and proximal calf (medial gastrocnemius) with associated bursitis.  Plan: - Prednisone 40mg  for 1 weeks - Compression sleeve use regularly over the next few weeks - Heel lift in both shoes provided today (insert this underneath your insole) - Continue to ice and can follow-up the prednisone with Celebrex or Ibuprofen  Follow-up in 2 weeks and we will start rehab exercises.

## 2023-10-13 NOTE — Addendum Note (Signed)
 Addended by: Enid Baas B on: 10/13/2023 09:52 PM   Modules accepted: Level of Service

## 2023-10-18 DIAGNOSIS — L814 Other melanin hyperpigmentation: Secondary | ICD-10-CM | POA: Diagnosis not present

## 2023-10-18 DIAGNOSIS — D225 Melanocytic nevi of trunk: Secondary | ICD-10-CM | POA: Diagnosis not present

## 2023-10-18 DIAGNOSIS — D220 Melanocytic nevi of lip: Secondary | ICD-10-CM | POA: Diagnosis not present

## 2023-10-18 DIAGNOSIS — L720 Epidermal cyst: Secondary | ICD-10-CM | POA: Diagnosis not present

## 2023-10-18 DIAGNOSIS — L821 Other seborrheic keratosis: Secondary | ICD-10-CM | POA: Diagnosis not present

## 2023-10-18 DIAGNOSIS — D1801 Hemangioma of skin and subcutaneous tissue: Secondary | ICD-10-CM | POA: Diagnosis not present

## 2023-11-21 ENCOUNTER — Ambulatory Visit: Admitting: Family Medicine

## 2023-11-21 VITALS — BP 128/86 | Ht 66.0 in | Wt 198.0 lb

## 2023-11-21 DIAGNOSIS — M25562 Pain in left knee: Secondary | ICD-10-CM

## 2023-11-21 MED ORDER — NITROGLYCERIN 0.2 MG/HR TD PT24
MEDICATED_PATCH | TRANSDERMAL | 1 refills | Status: AC
Start: 2023-11-21 — End: ?

## 2023-11-21 NOTE — Patient Instructions (Addendum)
 Your ultrasound looks better - the pocket of fluid has resolved.  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 Icing 15 minutes at a time 3-4 times a day. Consider compression. Start nitroglycerin patches 1/4th patch to affected area, change daily. Do home exercises daily - consider formal physical therapy. Follow up with [place] in 6 weeks.

## 2023-11-21 NOTE — Progress Notes (Unsigned)
 PCP: Norton Blizzard, MD  Subjective:   HPI:  Patient is a 73 y.o. female presenting for follow-up of left medial knee pain. Since her last visit in February, she reports overall improvement in her symptoms, although the pain has not resolved completely. She denies any loss of stability and is now able to alternate steps when going upstairs, which was not possible at her previous visit. The patient was able to ride a stationary bike a few days ago without significant issues. She has completed a one-week course of oral steroids (40mg ) and is taking Celebrex for pain management. She reports that icing the knee helps alleviate discomfort throughout the day. The patient denies any back pain, numbness, or tingling. However, the medial knee pain remains bothersome.    Past Medical History:  Diagnosis Date   Asthma    Hyperlipidemia    Sleep apnea    uses Mouth Piece    Current Outpatient Medications on File Prior to Visit  Medication Sig Dispense Refill   Ascorbic Acid (VITAMIN C PO) Take by mouth.     Cyanocobalamin (VITAMIN B-12 PO) Take by mouth.     predniSONE (DELTASONE) 20 MG tablet Take 1 tablet (20 mg total) by mouth 2 (two) times daily. 14 tablet 0   rosuvastatin (CRESTOR) 10 MG tablet Take 10 mg by mouth at bedtime.     VITAMIN D PO Take by mouth.     No current facility-administered medications on file prior to visit.    Past Surgical History:  Procedure Laterality Date   ANKLE FRACTURE SURGERY     COLONOSCOPY  11/01/2011   Dr.Patterson   EXCISION OF ENLARGED LYMPH NODE     GYNECOLOGIC CRYOSURGERY      No Known Allergies  BP 128/86   Ht 5\' 6"  (1.676 m)   Wt 198 lb (89.8 kg)   BMI 31.96 kg/m       No data to display              No data to display              Objective:  Physical Exam:  Gen: NAD, comfortable in exam room  MSK:  Left Knee: No swelling or bruising observed. Tenderness to palpation at the pes anserine bursa and distal posteromedial  hamstring tendons. Full range of motion (ROM) with extension, but limited on flexion. Strength 5/5 bilaterally. Negative varus/valgus stress tests.  Neuro: Sensation and motor function intact. Gait and stability are within normal limits.  Limited MSK u/s left knee:  swelling resolved medially between medial gastroc and semimembranosus.   Assessment & Plan:  The patient's left medial knee pain is most consistent with pes anserine tendinopathy, given the location of tenderness and persistent discomfort despite improvement in mobility and function. The recent ultrasound shows resolution of the fluid pocket, indicating a reduction in inflammation. The patient continues to experience bothersome pain, especially with certain activities, but reports no neurological deficits, and her gait and stability remain normal. Overall reassuring the progress is positive despite residual pain.   Plan:  - Continue low-impact home exercises daily. Consider formal physical therapy. - Start nitroglycerin patches, 1/4th patch to affected area, change daily.  - Continue icing 15 minutes at a time 3-4 times a day - Consider compression  - Follow up in 6 weeks

## 2023-11-22 ENCOUNTER — Encounter: Payer: Self-pay | Admitting: Family Medicine

## 2023-11-22 MED ORDER — CELECOXIB 200 MG PO CAPS: 200.0000 mg | ORAL_CAPSULE | Freq: Two times a day (BID) | ORAL | 1 refills | Status: AC

## 2024-01-02 ENCOUNTER — Ambulatory Visit: Admitting: Family Medicine

## 2024-01-02 VITALS — BP 122/84 | Ht 66.0 in | Wt 198.0 lb

## 2024-01-02 DIAGNOSIS — M25562 Pain in left knee: Secondary | ICD-10-CM | POA: Diagnosis not present

## 2024-01-02 NOTE — Progress Notes (Addendum)
 PCP: Aldo Hun, MD  Subjective:   HPI: Patient is a 73 y.o. female here for f/u of LT Knee pain.  Kaitlyn Norman reports her LT medial knee pain is 50% improved. She does feel like she is losing some strength. She has been doing her home PT exercises and using the nitroglycerin  patches daily. She is icing her knee regularly with relief too. Using Ibuprofen PRN. She has been using her compression knee sleeve regularly except for the last week due to a tic bite and case of cellulitis, which has improved with doxycycline. She is still having most of her pain with getting out of the car and going up stairs.   Past Medical History:  Diagnosis Date   Asthma    Hyperlipidemia    Sleep apnea    uses Mouth Piece    Current Outpatient Medications on File Prior to Visit  Medication Sig Dispense Refill   Ascorbic Acid (VITAMIN C PO) Take by mouth.     celecoxib  (CELEBREX ) 200 MG capsule Take 1 capsule (200 mg total) by mouth 2 (two) times daily. 60 capsule 1   Cyanocobalamin  (VITAMIN B-12 PO) Take by mouth.     nitroGLYCERIN  (NITRODUR - DOSED IN MG/24 HR) 0.2 mg/hr patch Apply 1/4th patch to affected tendon, change daily 30 patch 1   predniSONE  (DELTASONE ) 20 MG tablet Take 1 tablet (20 mg total) by mouth 2 (two) times daily. 14 tablet 0   rosuvastatin (CRESTOR) 10 MG tablet Take 10 mg by mouth at bedtime.     VITAMIN D PO Take by mouth.     No current facility-administered medications on file prior to visit.    Past Surgical History:  Procedure Laterality Date   ANKLE FRACTURE SURGERY     COLONOSCOPY  11/01/2011   Dr.Patterson   EXCISION OF ENLARGED LYMPH NODE     GYNECOLOGIC CRYOSURGERY      No Known Allergies  BP 122/84   Ht 5\' 6"  (1.676 m)   Wt 198 lb (89.8 kg)   BMI 31.96 kg/m       No data to display              No data to display              Objective:  Physical Exam:  Gen: NAD, comfortable in exam room  LT Knee Exam: Inspection: No obvious deformity,  effusion or ecchymoses. Palpation: TTP along medial and lateral joint lines. TTP at the pes anserine bursa and distal medial hamstring tendons. No TTP along patella. Strength: 5/5 bilaterally with knee extension and flexion. Neuro/vasc: Sensation intact distally bilaterally. Special Tests: Negative McMurray. Negative Apley. Negative Thessaly. Negative anterior drawer. No joint laxity with varus and valgus forces.   Assessment & Plan:  Kaitlyn Norman is a 73yo F with hx of pes anserine tendinopathy presenting for f/u of LT knee pain  1. LT Knee Pain, secondary to pes anserine tendinopathy - Patient reports 50% improvement in her symptoms with home PT and nitroglycerin  patches. She endorses some loss of strength. Exam shows TTP along pes anserine bursa as well as medial and lateral joint line TTP. Negative McMurray and Thessaly. Personally reviewed US  images of LT knee from 10/13/23 which showed some mild joint narrowing and some spurring medially. Suspect patient's symptoms are primarily due to pes anserine tendinopathy but also component of LT knee OA. Consider LT Knee Standing X-rays in the future if symptoms persist. Discussed this with patient today. -Sent referral for formal PT -  Recommend continued use of transdermal nitroglycerin  patches daily. -Continue ice and knee compression sleeve -F/u in 6 weeks.   Unknown Garbe, MS4 [facility] [employer]

## 2024-01-11 DIAGNOSIS — M1712 Unilateral primary osteoarthritis, left knee: Secondary | ICD-10-CM | POA: Diagnosis not present

## 2024-01-11 DIAGNOSIS — M25562 Pain in left knee: Secondary | ICD-10-CM | POA: Diagnosis not present

## 2024-01-11 DIAGNOSIS — R269 Unspecified abnormalities of gait and mobility: Secondary | ICD-10-CM | POA: Diagnosis not present

## 2024-01-17 DIAGNOSIS — R269 Unspecified abnormalities of gait and mobility: Secondary | ICD-10-CM | POA: Diagnosis not present

## 2024-01-17 DIAGNOSIS — M1712 Unilateral primary osteoarthritis, left knee: Secondary | ICD-10-CM | POA: Diagnosis not present

## 2024-01-17 DIAGNOSIS — M25562 Pain in left knee: Secondary | ICD-10-CM | POA: Diagnosis not present

## 2024-01-20 DIAGNOSIS — M1712 Unilateral primary osteoarthritis, left knee: Secondary | ICD-10-CM | POA: Diagnosis not present

## 2024-01-20 DIAGNOSIS — R269 Unspecified abnormalities of gait and mobility: Secondary | ICD-10-CM | POA: Diagnosis not present

## 2024-01-20 DIAGNOSIS — M25562 Pain in left knee: Secondary | ICD-10-CM | POA: Diagnosis not present

## 2024-01-23 DIAGNOSIS — R269 Unspecified abnormalities of gait and mobility: Secondary | ICD-10-CM | POA: Diagnosis not present

## 2024-01-23 DIAGNOSIS — M1712 Unilateral primary osteoarthritis, left knee: Secondary | ICD-10-CM | POA: Diagnosis not present

## 2024-01-23 DIAGNOSIS — M25562 Pain in left knee: Secondary | ICD-10-CM | POA: Diagnosis not present

## 2024-01-26 DIAGNOSIS — R269 Unspecified abnormalities of gait and mobility: Secondary | ICD-10-CM | POA: Diagnosis not present

## 2024-01-26 DIAGNOSIS — M25562 Pain in left knee: Secondary | ICD-10-CM | POA: Diagnosis not present

## 2024-01-26 DIAGNOSIS — M1712 Unilateral primary osteoarthritis, left knee: Secondary | ICD-10-CM | POA: Diagnosis not present

## 2024-01-30 DIAGNOSIS — R269 Unspecified abnormalities of gait and mobility: Secondary | ICD-10-CM | POA: Diagnosis not present

## 2024-01-30 DIAGNOSIS — M1712 Unilateral primary osteoarthritis, left knee: Secondary | ICD-10-CM | POA: Diagnosis not present

## 2024-01-30 DIAGNOSIS — M25562 Pain in left knee: Secondary | ICD-10-CM | POA: Diagnosis not present

## 2024-02-01 DIAGNOSIS — H52203 Unspecified astigmatism, bilateral: Secondary | ICD-10-CM | POA: Diagnosis not present

## 2024-02-01 DIAGNOSIS — H25813 Combined forms of age-related cataract, bilateral: Secondary | ICD-10-CM | POA: Diagnosis not present

## 2024-02-08 DIAGNOSIS — M1712 Unilateral primary osteoarthritis, left knee: Secondary | ICD-10-CM | POA: Diagnosis not present

## 2024-02-08 DIAGNOSIS — M25562 Pain in left knee: Secondary | ICD-10-CM | POA: Diagnosis not present

## 2024-02-08 DIAGNOSIS — R269 Unspecified abnormalities of gait and mobility: Secondary | ICD-10-CM | POA: Diagnosis not present

## 2024-02-13 ENCOUNTER — Ambulatory Visit: Admitting: Family Medicine

## 2024-02-13 DIAGNOSIS — M25562 Pain in left knee: Secondary | ICD-10-CM | POA: Diagnosis not present

## 2024-02-13 DIAGNOSIS — M1712 Unilateral primary osteoarthritis, left knee: Secondary | ICD-10-CM | POA: Diagnosis not present

## 2024-02-13 DIAGNOSIS — R269 Unspecified abnormalities of gait and mobility: Secondary | ICD-10-CM | POA: Diagnosis not present

## 2024-02-15 ENCOUNTER — Ambulatory Visit: Admitting: Family Medicine

## 2024-02-15 ENCOUNTER — Encounter: Payer: Self-pay | Admitting: Family Medicine

## 2024-02-15 VITALS — BP 124/63 | Ht 66.0 in | Wt 195.0 lb

## 2024-02-15 DIAGNOSIS — M25562 Pain in left knee: Secondary | ICD-10-CM | POA: Diagnosis not present

## 2024-02-15 NOTE — Progress Notes (Signed)
 PCP: Kaitlyn Anes, MD  Subjective:   HPI: Patient is a 73 y.o. female here for left knee pain.  5/19[name] reports her LT medial knee pain is 50% improved. She does feel like she is losing some strength. She has been doing her home PT exercises and using the nitroglycerin  patches daily. She is icing her knee regularly with relief too. Using Ibuprofen PRN. She has been using her compression knee sleeve regularly except for the last week due to a tic bite and case of cellulitis, which has improved with doxycycline. She is still having most of her pain with getting out of the car and going up stairs.  7/2: Patient reports she feels much better. A little discomfort on the day of physical therapy. Not needing any medication now for this. Doing home exercises.  Past Medical History:  Diagnosis Date   Asthma    Hyperlipidemia    Sleep apnea    uses Mouth Piece    Current Outpatient Medications on File Prior to Visit  Medication Sig Dispense Refill   Ascorbic Acid (VITAMIN C PO) Take by mouth.     celecoxib  (CELEBREX ) 200 MG capsule Take 1 capsule (200 mg total) by mouth 2 (two) times daily. 60 capsule 1   Cyanocobalamin  (VITAMIN B-12 PO) Take by mouth.     nitroGLYCERIN  (NITRODUR - DOSED IN MG/24 HR) 0.2 mg/hr patch Apply 1/4th patch to affected tendon, change daily 30 patch 1   predniSONE  (DELTASONE ) 20 MG tablet Take 1 tablet (20 mg total) by mouth 2 (two) times daily. 14 tablet 0   rosuvastatin (CRESTOR) 10 MG tablet Take 10 mg by mouth at bedtime.     VITAMIN D PO Take by mouth.     No current facility-administered medications on file prior to visit.    Past Surgical History:  Procedure Laterality Date   ANKLE FRACTURE SURGERY     COLONOSCOPY  11/01/2011   Dr.Patterson   EXCISION OF ENLARGED LYMPH NODE     GYNECOLOGIC CRYOSURGERY      No Known Allergies  BP 124/63   Ht 5' 6 (1.676 m)   Wt 195 lb (88.5 kg)   BMI 31.47 kg/m       No data to display               No data to display              Objective:  Physical Exam:  Gen: NAD, comfortable in exam room  Left knee: No gross deformity, ecchymoses, swelling. No TTP currently. FROM with normal strength. Negative ant/post drawers. Negative valgus/varus testing NV intact distally.   Assessment & Plan:  1. Left knee pain - due to arthritis and pes tendinopathy.  Much improved.  Continue home exercises after completing physical therapy.  Do exercises for about 4 more weeks.  Call us  with any questions or follow up as needed.  Ibuprofen as needed.

## 2024-02-22 DIAGNOSIS — M1712 Unilateral primary osteoarthritis, left knee: Secondary | ICD-10-CM | POA: Diagnosis not present

## 2024-02-22 DIAGNOSIS — R269 Unspecified abnormalities of gait and mobility: Secondary | ICD-10-CM | POA: Diagnosis not present

## 2024-02-22 DIAGNOSIS — M25562 Pain in left knee: Secondary | ICD-10-CM | POA: Diagnosis not present

## 2024-03-05 ENCOUNTER — Other Ambulatory Visit: Payer: Self-pay | Admitting: Obstetrics and Gynecology

## 2024-03-05 DIAGNOSIS — Z1231 Encounter for screening mammogram for malignant neoplasm of breast: Secondary | ICD-10-CM

## 2024-03-12 DIAGNOSIS — M1712 Unilateral primary osteoarthritis, left knee: Secondary | ICD-10-CM | POA: Diagnosis not present

## 2024-03-12 DIAGNOSIS — R269 Unspecified abnormalities of gait and mobility: Secondary | ICD-10-CM | POA: Diagnosis not present

## 2024-03-12 DIAGNOSIS — M25562 Pain in left knee: Secondary | ICD-10-CM | POA: Diagnosis not present

## 2024-03-15 ENCOUNTER — Ambulatory Visit

## 2024-04-17 DIAGNOSIS — E785 Hyperlipidemia, unspecified: Secondary | ICD-10-CM | POA: Diagnosis not present

## 2024-04-17 DIAGNOSIS — E538 Deficiency of other specified B group vitamins: Secondary | ICD-10-CM | POA: Diagnosis not present

## 2024-04-17 DIAGNOSIS — Z1212 Encounter for screening for malignant neoplasm of rectum: Secondary | ICD-10-CM | POA: Diagnosis not present

## 2024-04-17 DIAGNOSIS — K219 Gastro-esophageal reflux disease without esophagitis: Secondary | ICD-10-CM | POA: Diagnosis not present

## 2024-04-17 DIAGNOSIS — D72818 Other decreased white blood cell count: Secondary | ICD-10-CM | POA: Diagnosis not present

## 2024-04-17 DIAGNOSIS — E559 Vitamin D deficiency, unspecified: Secondary | ICD-10-CM | POA: Diagnosis not present

## 2024-04-19 DIAGNOSIS — Z6833 Body mass index (BMI) 33.0-33.9, adult: Secondary | ICD-10-CM | POA: Diagnosis not present

## 2024-04-19 DIAGNOSIS — Z01419 Encounter for gynecological examination (general) (routine) without abnormal findings: Secondary | ICD-10-CM | POA: Diagnosis not present

## 2024-04-25 ENCOUNTER — Ambulatory Visit
Admission: RE | Admit: 2024-04-25 | Discharge: 2024-04-25 | Disposition: A | Discharge status: Home / Self Care | Source: Ambulatory Visit | Attending: Obstetrics and Gynecology | Admitting: Obstetrics and Gynecology

## 2024-04-25 DIAGNOSIS — R82998 Other abnormal findings in urine: Secondary | ICD-10-CM | POA: Diagnosis not present

## 2024-04-25 DIAGNOSIS — Z1231 Encounter for screening mammogram for malignant neoplasm of breast: Secondary | ICD-10-CM

## 2024-04-25 DIAGNOSIS — Z1331 Encounter for screening for depression: Secondary | ICD-10-CM | POA: Diagnosis not present

## 2024-04-25 DIAGNOSIS — M8580 Other specified disorders of bone density and structure, unspecified site: Secondary | ICD-10-CM | POA: Diagnosis not present

## 2024-04-25 DIAGNOSIS — E669 Obesity, unspecified: Secondary | ICD-10-CM | POA: Diagnosis not present

## 2024-04-25 DIAGNOSIS — E785 Hyperlipidemia, unspecified: Secondary | ICD-10-CM | POA: Diagnosis not present

## 2024-04-25 DIAGNOSIS — H8112 Benign paroxysmal vertigo, left ear: Secondary | ICD-10-CM | POA: Diagnosis not present

## 2024-04-25 DIAGNOSIS — G4733 Obstructive sleep apnea (adult) (pediatric): Secondary | ICD-10-CM | POA: Diagnosis not present

## 2024-04-25 DIAGNOSIS — I10 Essential (primary) hypertension: Secondary | ICD-10-CM | POA: Diagnosis not present

## 2024-04-25 DIAGNOSIS — E538 Deficiency of other specified B group vitamins: Secondary | ICD-10-CM | POA: Diagnosis not present

## 2024-04-25 DIAGNOSIS — Z0000 Encounter for general adult medical examination without abnormal findings: Secondary | ICD-10-CM | POA: Diagnosis not present

## 2024-04-25 DIAGNOSIS — E559 Vitamin D deficiency, unspecified: Secondary | ICD-10-CM | POA: Diagnosis not present

## 2024-04-25 DIAGNOSIS — D72818 Other decreased white blood cell count: Secondary | ICD-10-CM | POA: Diagnosis not present

## 2024-04-25 DIAGNOSIS — K219 Gastro-esophageal reflux disease without esophagitis: Secondary | ICD-10-CM | POA: Diagnosis not present

## 2024-05-07 DIAGNOSIS — L304 Erythema intertrigo: Secondary | ICD-10-CM | POA: Diagnosis not present

## 2024-05-07 DIAGNOSIS — L237 Allergic contact dermatitis due to plants, except food: Secondary | ICD-10-CM | POA: Diagnosis not present

## 2024-06-02 DIAGNOSIS — Z23 Encounter for immunization: Secondary | ICD-10-CM | POA: Diagnosis not present
